# Patient Record
Sex: Male | Born: 1940 | Race: White | Hispanic: No | Smoking: Never smoker
Health system: Southern US, Community
[De-identification: ages and names within clinical notes are randomized; demographics above are authoritative.]

## PROBLEM LIST (undated history)

## (undated) DIAGNOSIS — D0359 Melanoma in situ of other part of trunk: Secondary | ICD-10-CM

## (undated) DIAGNOSIS — Z789 Other specified health status: Secondary | ICD-10-CM

## (undated) HISTORY — DX: Melanoma in situ of other part of trunk: D03.59

---

## 1998-06-17 ENCOUNTER — Ambulatory Visit (HOSPITAL_COMMUNITY): Admission: RE | Admit: 1998-06-17 | Discharge: 1998-06-17 | Payer: Self-pay | Admitting: Gastroenterology

## 2000-09-12 ENCOUNTER — Encounter: Admission: RE | Admit: 2000-09-12 | Discharge: 2000-09-12 | Payer: Self-pay | Admitting: Sports Medicine

## 2004-06-30 ENCOUNTER — Encounter (INDEPENDENT_AMBULATORY_CARE_PROVIDER_SITE_OTHER): Payer: Self-pay | Admitting: *Deleted

## 2004-06-30 ENCOUNTER — Ambulatory Visit (HOSPITAL_COMMUNITY): Admission: RE | Admit: 2004-06-30 | Discharge: 2004-06-30 | Payer: Self-pay | Admitting: Gastroenterology

## 2005-12-11 ENCOUNTER — Ambulatory Visit: Payer: Self-pay | Admitting: Sports Medicine

## 2005-12-12 ENCOUNTER — Ambulatory Visit: Payer: Self-pay | Admitting: Family Medicine

## 2009-10-14 ENCOUNTER — Ambulatory Visit: Payer: Self-pay | Admitting: Family Medicine

## 2009-10-14 ENCOUNTER — Encounter: Payer: Self-pay | Admitting: Family Medicine

## 2009-10-14 LAB — CONVERTED CEMR LAB
ALT: 23 units/L (ref 0–53)
BUN: 22 mg/dL (ref 6–23)
CO2: 26 meq/L (ref 19–32)
Cholesterol: 168 mg/dL (ref 0–200)
Creatinine, Ser: 1.04 mg/dL (ref 0.40–1.50)
HDL: 64 mg/dL (ref >39)
Total Bilirubin: 0.8 mg/dL (ref 0.3–1.2)
Total CHOL/HDL Ratio: 2.6
Triglycerides: 60 mg/dL (ref <150)
VLDL: 12 mg/dL (ref 0–40)

## 2009-10-17 ENCOUNTER — Encounter: Payer: Self-pay | Admitting: Family Medicine

## 2009-10-17 LAB — CONVERTED CEMR LAB: PSA: 0.77 ng/mL (ref 0.10–4.00)

## 2010-02-22 ENCOUNTER — Encounter: Payer: Self-pay | Admitting: Family Medicine

## 2010-09-27 ENCOUNTER — Telehealth: Payer: Self-pay | Admitting: Family Medicine

## 2010-09-28 ENCOUNTER — Encounter: Payer: Self-pay | Admitting: Family Medicine

## 2010-10-09 ENCOUNTER — Encounter: Payer: Self-pay | Admitting: Family Medicine

## 2010-12-28 NOTE — Consult Note (Signed)
Summary: [facility]  [facility]   Imported By: De Nurse 10/13/2010 16:56:48  _____________________________________________________________________  External Attachment:    Type:   Image     Comment:   External Document

## 2010-12-28 NOTE — Miscellaneous (Signed)
Summary: colonoscopy results  Clinical Lists Changes  Observations: Added new observation of HEMOCCRECACT: Not indicated (02/22/2010 14:57) Added new observation of DM PROGRESS: N/A (02/22/2010 14:57) Added new observation of DM FSREVIEW: N/A (02/22/2010 14:57) Added new observation of HTN PROGRESS: N/A (02/22/2010 14:57) Added new observation of HTN FSREVIEW: N/A (02/22/2010 14:57) Added new observation of LIPID PROGRS: N/A (02/22/2010 14:57) Added new observation of LIPID FSREVW: N/A (02/22/2010 14:57) Added new observation of COLONOSCOPY: Small internal and external hemorrhoids; diverticulosis in the sigmoid, decending and tranverse colon; one small polyp in the recto-sigmoid-resected and retreived. (02/13/2010 14:59)      Colonoscopy  Procedure date:  02/13/2010  Findings:      Small internal and external hemorrhoids; diverticulosis in the sigmoid, decending and tranverse colon; one small polyp in the recto-sigmoid-resected and retreived.    Prevention & Chronic Care Immunizations   Influenza vaccine: Fluvax MCR  (10/14/2009)    Tetanus booster: 10/14/2009: Tdap    Pneumococcal vaccine: Pneumovax (Medicare)  (10/14/2009)    H. zoster vaccine: Not documented  Colorectal Screening   Hemoccult: Not documented   Hemoccult action/deferral: Not indicated  (02/22/2010)    Colonoscopy: Small internal and external hemorrhoids; diverticulosis in the sigmoid, decending and tranverse colon; one small polyp in the recto-sigmoid-resected and retreived.  (02/13/2010)  Other Screening   PSA: 0.77  (10/17/2009)   Smoking status: never  (10/14/2009)  Lipids   Total Cholesterol: 168  (10/14/2009)   Lipid panel action/deferral: Lipid Panel ordered   LDL: 92  (10/14/2009)   LDL Direct: Not documented   HDL: 64  (10/14/2009)   Triglycerides: 60  (10/14/2009)

## 2010-12-28 NOTE — Progress Notes (Signed)
Summary: Letter  Phone Note Call from Patient Call back at [phone]   Reason for Call: Talk to Nurse Summary of Call: pts wife is requesting a letter stating pt needs to have a hearing test since it has been 5 years so pt may get an adjustment.  Initial call taken by: Knox Royalty,  September 27, 2010 9:09 AM  Follow-up for Phone Call        completed and placed up front for pick up Follow-up by: Luretha Murphy NP,  September 28, 2010 8:24 AM     Forwarded to Haydee Monica NP.  Starleen Blue RN  September 27, 2010 11:55 AM

## 2010-12-28 NOTE — Letter (Signed)
Summary: Generic Letter  [facility]  [address]   [place], [place] [address]   Phone: [phone]  Fax: [phone]    09/28/2010  Regarding:  ELSTER CORBELLO [address] [place], [place]  [address]  To whom it may concern,  This is to verify that Jacqulyn Ducking has not has his hearing evaluated for 5 years and is due for this testing.  Hearing is an essential component to functioning at a high level in life.           Sincerely,   Luretha Murphy NP

## 2011-01-03 ENCOUNTER — Encounter: Payer: Self-pay | Admitting: *Deleted

## 2011-01-25 ENCOUNTER — Encounter: Payer: Self-pay | Admitting: Home Health Services

## 2011-04-13 NOTE — Op Note (Signed)
NAME:  Stephen [name], Stephen [name] NO.:  [id number]   MEDICAL RECORD NO.:  [id number]                   PATIENT TYPE:  AMB   LOCATION:  ENDO                                 FACILITY:  [facility]   PHYSICIAN:  Petra Kuba, M.D.                 DATE OF BIRTH:  Mar 31, 1941   DATE OF PROCEDURE:  06/30/2004  DATE OF DISCHARGE:                                 OPERATIVE REPORT   PROCEDURE:  Colonoscopy.   INDICATION:  History of colon polyps, due for repeat screening.  Consent was  signed after risks, benefits, methods, options thoroughly discussed multiple  times in the past.  No sedation was used per patient request.   DESCRIPTION OF PROCEDURE:  Rectal inspection was pertinent for small  external hemorrhoids.  Digital exam was negative.  The video pediatric  adjustable colonoscope was inserted and with moderate difficulty due to a  long, looping, tortuous colon, with rolling him on his back and abdominal  pressure, was finally able to advance to the cecum.  On insertion, we saw  some left-sided diverticula but no other obvious abnormalities.  Cecum was  identified by the appendiceal orifice and the ileocecal valve.  The scope  was slowly withdrawn.  The prep was fairly adequate.  There was some stool  adherent to the wall which could not be washed off but on slow withdrawal  through the colon, the cecum and the ascending were normal.  A tiny polyp in  the hepatic flexure was seen and hot biopsied x 1.  The scope was further  withdrawn.  Other than the left-sided diverticula, no other polypoid lesions  were seen.  Once back to in the rectum, anorectal pull-through and  retroflexion confirmed some small hemorrhoids.  The scope was straightened  and readvanced a short ways up the left side of the colon; air was  suctioned, scope removed.  The patient tolerated the procedure well.  There  was no obvious immediate complication.   ENDOSCOPIC DIAGNOSES:  1. Small internal and  external hemorrhoids.  2. Left-sided diverticula.  3. Long, looping, tortuous colon.  4. Tiny hepatic flexure polyp, hot biopsied.  5. Otherwise, within normal limits to the cecum.   PLAN:  1. Await pathology but probably recheck colon screening in 5 years.  2. Happy to see back p.r.n..  3. Otherwise, return care to Dr. Darrick Penna for the customary health care     maintenance to include yearly rectals and guaiacs.                                               Petra Kuba, M.D.    MEM/MEDQ  D:  06/30/2004  T:  07/02/2004  Job:  [id number]   cc:   Theodoro Grist, MD  [facility]  [address]

## 2011-05-09 ENCOUNTER — Encounter: Payer: Self-pay | Admitting: Home Health Services

## 2011-05-09 ENCOUNTER — Ambulatory Visit (INDEPENDENT_AMBULATORY_CARE_PROVIDER_SITE_OTHER): Payer: Medicare Other | Admitting: Home Health Services

## 2011-05-09 VITALS — BP 127/78 | HR 64 | Temp 98.3°F | Ht 72.0 in | Wt 158.0 lb

## 2011-05-09 DIAGNOSIS — Z0000 Encounter for general adult medical examination without abnormal findings: Secondary | ICD-10-CM

## 2011-05-09 NOTE — Progress Notes (Signed)
Patient here for annual wellness visit, patient reports: Risk Factors/Conditions needing evaluation or treatment: Patient does not have any risk factors that need evaluation. Home Safety: Patient lives with spouse in 2 story home.  Pt reports having smoke detectors and adaptive equipment in bathroom. Other Information: Corrective lens: Patient has corrective lens for reading.  Visits eye doctor every 2 years. Dentures: Patient has a partial and a bridge and visits dentist every 6 months. Memory: Patient reports some memory loss. Patient's Mini Mental Score (recorded in doc. flowsheet): 30 Patient wears hearing aids in both ears.  Balance/Gait: Pt does not have any noticeable impairment.  Balance Abnormal Patient value  Sitting balance    Sit to stand    Attempts to arise    Immediate standing balance    Standing balance    Nudge    Eyes closed- Romberg    Tandem stance    Back lean    Neck Rotation    360 degree turn    Sitting down     Gait Abnormal Patient value  Initiation of gait    Step length-left    Step length-right    Step height-left    Step height-right    Step symmetry    Step continuity    Path deviation    Trunk movement    Walking stance        Annual Wellness Visit Requirements Recorded Today In  Medical, family, social history Past Medical, Family, Social History Section  Current providers Care team  Current medications Medications  Wt, BP, Ht, BMI Vital signs  Hearing assessment (welcome visit) Declined- has hearing aids  Tobacco, alcohol, illicit drug use History  ADL Nurse Assessment  Depression Screening Nurse Assessment  Cognitive impairment Nurse Assessment  Mini Mental Status Document Flowsheet  Fall Risk Nurse Assessment  Home Safety Progress Note  End of Life Planning (welcome visit) Social Documentation  Medicare preventative services Progress Note  Risk factors/conditions needing evaluation/treatment Progress Note  Personalized  health advice Patient Instructions, goals, letter  Diet & Exercise Social Documentation  Emergency Contact Social Documentation  Seat Belts Social Documentation  Sun exposure/protection Social Documentation    Medicare Prevention Plan: Recommended pt get shingles vaccine.   Recommended Medicare Prevention Screenings Men over 40 Test For Frequency Date of Last- BOLD if needed  Colorectal Cancer 1-10 yrs 3/11  Prostate Cancer Never or yearly 11/10  Aortic Aneurysm Once if 65-75 with hx of smoking Discuss with Darl Pikes if concerned  Cholesterol 5 yrs 11/10  Diabetes yearly Non diabetic  HIV yearly declined  Influenza Shot yearly 2011- pt reported  Pneumonia Shot once 2011- pt reported  Zostavax Shot once recommended

## 2011-05-09 NOTE — Patient Instructions (Signed)
1. Consider getting shingles vaccine at pharmacy. 2. Bring in copy of medical wishes.  3. If you have a record of when you got your pneumonia vaccine, please bring a copy to [place].  4. I have included some information about [employer] Silver Sneakers program. Consider contacting [employer] for enrollment.

## 2011-05-10 ENCOUNTER — Encounter: Payer: Self-pay | Admitting: Home Health Services

## 2011-05-10 NOTE — Progress Notes (Signed)
  Subjective:    Patient ID: Stephen [name], male    DOB: 1940-12-23, 70 y.o.   MRN: [id number]  HPI    Review of Systems     Objective:   Physical Exam        Assessment & Plan:  Reviewed Stephen [name]'s note and agree with assessment and plan.

## 2012-03-06 ENCOUNTER — Encounter: Payer: Self-pay | Admitting: Family Medicine

## 2012-03-06 ENCOUNTER — Ambulatory Visit (INDEPENDENT_AMBULATORY_CARE_PROVIDER_SITE_OTHER): Payer: Medicare Other | Admitting: Family Medicine

## 2012-03-06 VITALS — BP 133/74 | HR 49 | Ht 72.0 in | Wt 162.0 lb

## 2012-03-06 DIAGNOSIS — G3184 Mild cognitive impairment, so stated: Secondary | ICD-10-CM | POA: Insufficient documentation

## 2012-03-06 DIAGNOSIS — R413 Other amnesia: Secondary | ICD-10-CM

## 2012-03-06 NOTE — Assessment & Plan Note (Addendum)
I think this is normal aging with possibility of very early alzheimer's. My ROS and testing rules out motor diseases. Recommended brain exercises.

## 2012-03-06 NOTE — Patient Instructions (Signed)
[email or web address]  Great brain exercises.

## 2012-03-06 NOTE — Progress Notes (Addendum)
  Subjective:    Patient ID: Stephen [name], male    DOB: 1941-05-28, 71 y.o.   MRN: [id number]  HPI 1. Short term memory loss, inability to focus on certain tasks.  History of noting one year of worsening memory, short term, and memory loss while performing tasks. He has always had some minor difficulties with short term memory. 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 He exercises daily and has no other symptoms.  MMSE: 30/30 PHQ-9 0/9  Tobacco use: Patient is a non smoker Daily one drink a day. No drugs. Actively exercises Very balanced diet with fruits and vegetables.  Review of Systems Pertinent items are noted in HPI. No GI symptosm. No motor symptoms.     Objective:   Physical Exam Filed Vitals:   03/06/12 0858  BP: 133/74  Pulse: 49  Height: 6' (1.829 m)  Weight: 162 lb (73.483 kg)  Psych:  Cognition and judgment appear intact. Alert, communicative  and cooperative with normal attention span and concentration. No apparent delusions, illusions, hallucinations Neurologic exam : Cn 2-7 intact Strength equal & normal in upper & lower extremities Able to walk on heels and toes.   Balance normal  Romberg normal, finger to nose   No cogwheel, no tremors, no shuffling gait. No gait abnormalities.   On the [facility] Cognitive Assessment no deficits were found  I interviewed and examined this patient and discussed the care plan with Dr. Rivka Safer  and agree with assessment and plan as documented in the visit note for today.    Stephen A. Sheffield Slider, MD Family Medicine Teaching Service Attending  03/13/2012 4:39 PM  Assessment & Plan:

## 2012-11-26 HISTORY — PX: OTHER SURGICAL HISTORY: SHX169

## 2013-02-17 ENCOUNTER — Other Ambulatory Visit: Payer: Self-pay | Admitting: Dermatology

## 2013-02-26 ENCOUNTER — Ambulatory Visit (INDEPENDENT_AMBULATORY_CARE_PROVIDER_SITE_OTHER): Payer: 59 | Admitting: General Surgery

## 2013-02-26 ENCOUNTER — Encounter (INDEPENDENT_AMBULATORY_CARE_PROVIDER_SITE_OTHER): Payer: Self-pay | Admitting: General Surgery

## 2013-02-26 VITALS — BP 132/76 | HR 64 | Temp 98.6°F | Resp 18 | Ht 73.0 in | Wt 162.0 lb

## 2013-02-26 DIAGNOSIS — C4359 Malignant melanoma of other part of trunk: Secondary | ICD-10-CM

## 2013-02-26 NOTE — Progress Notes (Signed)
Patient ID: Stephen [name], male   DOB: 06/03/1941, 72 y.o.   MRN: [id number]  Chief Complaint  Patient presents with  . New Evaluation    melannoma rt side shoulder    HPI Stephen [name] is a 72 y.o. male.  Referred by Dr. Houston HPI This is a 72-year-old healthy male who has a history of basal and squamous cell cancers. He was recently seen by Dr. 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 This malignant melanomas a superficial spreading melanoma that is at least 1.35 mm thick. Mitoses are greater than 10 per millimeter squared, regression is present, and ulceration is present. The lesion does extend to the deep specimen edge right now. He comes in today without any complaints to discuss excision.  History reviewed. No pertinent past medical history.  Past Surgical History  Procedure Laterality Date  . Basal /sq cell removal  2014    x-4 times removal    Family History  Problem Relation Age of Onset  . Heart disease Mother   . Diabetes Mother   . Arthritis Mother   . Glaucoma Father   . Heart disease Father   . Arthritis Father     Social History History  Substance Use Topics  . Smoking status: Never Smoker   . Smokeless tobacco: Never Used  . Alcohol Use: 3.5 oz/week    7 drink(s) per week    No Known Allergies  Current Outpatient Prescriptions  Medication Sig Dispense Refill  . hydrocortisone valerate cream (WESTCORT) 0.2 %       . zoster vaccine live, PF, (ZOSTAVAX) 19400 UNT/0.65ML injection Administer once        No current facility-administered medications for this visit.    Review of Systems Review of Systems  Constitutional: Negative for fever, chills and unexpected weight change.  HENT: Negative for hearing loss, congestion, sore throat, trouble swallowing and voice change.   Eyes: Negative  for visual disturbance.  Respiratory: Negative for cough and wheezing.   Cardiovascular: Negative for chest pain, palpitations and leg swelling.  Gastrointestinal: Negative for nausea, vomiting, abdominal pain, diarrhea, constipation, blood in stool, abdominal distention, anal bleeding and rectal pain.  Genitourinary: Negative for hematuria and difficulty urinating.  Musculoskeletal: Negative for arthralgias.  Skin: Negative for rash and wound.  Neurological: Negative for seizures, syncope, weakness and headaches.  Hematological: Negative for adenopathy. Does not bruise/bleed easily.  Psychiatric/Behavioral: Negative for confusion.    Blood pressure 132/76, pulse 64, temperature 98.6 F (37 C), resp. rate 18, height 6' 1" (1.854 m), weight 162 lb (73.483 kg).  Physical Exam Physical Exam  Vitals reviewed. Constitutional: He appears well-developed and well-nourished.  Cardiovascular: Normal rate, regular rhythm and normal heart sounds.   Pulmonary/Chest: Effort normal and breath sounds normal. He has no wheezes. He has no rales.    Lymphadenopathy:    He has no cervical adenopathy.    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 I discussed with him a wide local excision with margins as well as a sentinel lymph node biopsy. We discussed the postoperative restrictions. We discussed the performance of a sentinel lymph node biopsy. I'm going to have him get a lymphoscintigram. I will then schedule   him for wide local excision with sentinel node biopsy next week.        Joleah Kosak 02/26/2013, 3:18 PM    

## 2013-02-27 ENCOUNTER — Encounter (HOSPITAL_COMMUNITY)
Admission: RE | Admit: 2013-02-27 | Discharge: 2013-02-27 | Disposition: A | Discharge status: Home / Self Care | Payer: Medicare Other | Source: Ambulatory Visit | Attending: General Surgery | Admitting: General Surgery

## 2013-02-27 ENCOUNTER — Encounter: Payer: Self-pay | Admitting: Family Medicine

## 2013-02-27 ENCOUNTER — Other Ambulatory Visit (INDEPENDENT_AMBULATORY_CARE_PROVIDER_SITE_OTHER): Payer: Self-pay | Admitting: General Surgery

## 2013-02-27 DIAGNOSIS — C4359 Malignant melanoma of other part of trunk: Secondary | ICD-10-CM | POA: Insufficient documentation

## 2013-02-27 DIAGNOSIS — C439 Malignant melanoma of skin, unspecified: Secondary | ICD-10-CM | POA: Insufficient documentation

## 2013-02-27 MED ORDER — TECHNETIUM TC 99M SULFUR COLLOID FILTERED
0.5000 | Freq: Once | INTRAVENOUS | Status: AC | PRN
  Administered 2013-02-27: 0.5 via INTRADERMAL

## 2013-03-03 ENCOUNTER — Encounter (HOSPITAL_COMMUNITY)
Admission: RE | Admit: 2013-03-03 | Discharge: 2013-03-03 | Disposition: A | Discharge status: Home / Self Care | Payer: Medicare Other | Source: Ambulatory Visit | Attending: General Surgery | Admitting: General Surgery

## 2013-03-03 ENCOUNTER — Encounter (HOSPITAL_COMMUNITY): Payer: Self-pay

## 2013-03-03 HISTORY — DX: Other specified health status: Z78.9

## 2013-03-03 LAB — BASIC METABOLIC PANEL
BUN: 16 mg/dL (ref 6–23)
Chloride: 106 mEq/L (ref 96–112)
Creatinine, Ser: 1.04 mg/dL (ref 0.50–1.35)
GFR calc Af Amer: 81 mL/min — ABNORMAL LOW (ref >90)
Glucose, Bld: 100 mg/dL — ABNORMAL HIGH (ref 70–99)

## 2013-03-03 LAB — CBC WITH DIFFERENTIAL/PLATELET
Basophils Absolute: 0.1 10*3/uL (ref 0.0–0.1)
Basophils Relative: 1 % (ref 0–1)
Eosinophils Absolute: 0 10*3/uL (ref 0.0–0.7)
Eosinophils Relative: 1 % (ref 0–5)
HCT: 41.9 % (ref 39.0–52.0)
MCH: 31.3 pg (ref 26.0–34.0)
MCHC: 35.1 g/dL (ref 30.0–36.0)
Monocytes Absolute: 0.2 10*3/uL (ref 0.1–1.0)
Monocytes Relative: 4 % (ref 3–12)
Neutro Abs: 3.2 10*3/uL (ref 1.7–7.7)
RDW: 12.7 % (ref 11.5–15.5)

## 2013-03-03 NOTE — Pre-Procedure Instructions (Signed)
Victoria Euceda  03/03/2013   Your procedure is scheduled on:  03/05/13  Report to [facility] at 8 AM.  Call this number if you have problems the morning of surgery: [phone]   Remember:   Do not eat food or drink liquids after midnight.   Take these medicines the morning of surgery with A SIP OF WATER: none   Do not wear jewelry, make-up or nail polish.  Do not wear lotions, powders, or perfumes. You may wear deodorant.  Do not shave 48 hours prior to surgery. Men may shave face and neck.  Do not bring valuables to the hospital.  Contacts, dentures or bridgework may not be worn into surgery.  Leave suitcase in the car. After surgery it may be brought to your room.  For patients admitted to the hospital, checkout time is 11:00 AM the day of  discharge.   Patients discharged the day of surgery will not be allowed to drive  home.  Name and phone number of your driver: family  Special Instructions: Shower using CHG 2 nights before surgery and the night before surgery.  If you shower the day of surgery use CHG.  Use special wash - you have one bottle of CHG for all showers.  You should use approximately 1/3 of the bottle for each shower.   Please read over the following fact sheets that you were given: Pain Booklet, Coughing and Deep Breathing, MRSA Information and Surgical Site Infection Prevention

## 2013-03-04 ENCOUNTER — Encounter (HOSPITAL_COMMUNITY): Payer: Self-pay | Admitting: Pharmacy Technician

## 2013-03-04 MED ORDER — CEFAZOLIN SODIUM-DEXTROSE 2-3 GM-% IV SOLR
2.0000 g | INTRAVENOUS | Status: AC
  Administered 2013-03-05: 2 g via INTRAVENOUS
  Filled 2013-03-04: qty 50

## 2013-03-05 ENCOUNTER — Encounter (HOSPITAL_COMMUNITY): Payer: Self-pay | Admitting: Surgery

## 2013-03-05 ENCOUNTER — Ambulatory Visit (HOSPITAL_COMMUNITY)
Admission: RE | Admit: 2013-03-05 | Discharge: 2013-03-05 | Disposition: A | Discharge status: Home / Self Care | Payer: Medicare Other | Source: Ambulatory Visit | Attending: General Surgery | Admitting: General Surgery

## 2013-03-05 ENCOUNTER — Encounter (HOSPITAL_COMMUNITY)
Admission: RE | Disposition: A | Discharge status: Home / Self Care | Payer: Self-pay | Source: Ambulatory Visit | Attending: General Surgery

## 2013-03-05 ENCOUNTER — Encounter (HOSPITAL_COMMUNITY)
Admission: RE | Admit: 2013-03-05 | Discharge: 2013-03-05 | Disposition: A | Discharge status: Home / Self Care | Payer: Medicare Other | Source: Ambulatory Visit | Attending: General Surgery | Admitting: General Surgery

## 2013-03-05 ENCOUNTER — Ambulatory Visit (HOSPITAL_COMMUNITY): Payer: Medicare Other | Admitting: Anesthesiology

## 2013-03-05 ENCOUNTER — Encounter (HOSPITAL_COMMUNITY): Payer: Self-pay | Admitting: Anesthesiology

## 2013-03-05 ENCOUNTER — Telehealth (INDEPENDENT_AMBULATORY_CARE_PROVIDER_SITE_OTHER): Payer: Self-pay

## 2013-03-05 DIAGNOSIS — C4360 Malignant melanoma of unspecified upper limb, including shoulder: Secondary | ICD-10-CM | POA: Insufficient documentation

## 2013-03-05 DIAGNOSIS — C439 Malignant melanoma of skin, unspecified: Secondary | ICD-10-CM | POA: Insufficient documentation

## 2013-03-05 DIAGNOSIS — L905 Scar conditions and fibrosis of skin: Secondary | ICD-10-CM | POA: Insufficient documentation

## 2013-03-05 DIAGNOSIS — Z85828 Personal history of other malignant neoplasm of skin: Secondary | ICD-10-CM | POA: Insufficient documentation

## 2013-03-05 DIAGNOSIS — C4359 Malignant melanoma of other part of trunk: Secondary | ICD-10-CM

## 2013-03-05 HISTORY — PX: AXILLARY SENTINEL NODE BIOPSY: SHX5738

## 2013-03-05 HISTORY — PX: EXCISION MELANOMA WITH SENTINEL LYMPH NODE BIOPSY: SHX5628

## 2013-03-05 SURGERY — EXCISION, MELANOMA, WITH SENTINEL LYMPH NODE BIOPSY
Anesthesia: General | Site: Back | Laterality: Right | Wound class: Clean

## 2013-03-05 MED ORDER — OXYCODONE HCL 5 MG PO TABS: 5.0000 mg | ORAL_TABLET | Freq: Once | ORAL | Status: DC

## 2013-03-05 MED ORDER — 0.9 % SODIUM CHLORIDE (POUR BTL) OPTIME
TOPICAL | Status: DC | PRN
  Administered 2013-03-05: 1000 mL

## 2013-03-05 MED ORDER — TECHNETIUM TC 99M SULFUR COLLOID FILTERED
0.5000 | Freq: Once | INTRAVENOUS | Status: AC | PRN
  Administered 2013-03-05: 0.5 via INTRADERMAL

## 2013-03-05 MED ORDER — SODIUM CHLORIDE 0.9 % IJ SOLN
INTRAMUSCULAR | Status: DC | PRN
  Administered 2013-03-05: 11:00:00

## 2013-03-05 MED ORDER — ROCURONIUM BROMIDE 100 MG/10ML IV SOLN
INTRAVENOUS | Status: DC | PRN
  Administered 2013-03-05: 40 mg via INTRAVENOUS

## 2013-03-05 MED ORDER — LACTATED RINGERS IV SOLN
INTRAVENOUS | Status: DC | PRN
  Administered 2013-03-05 (×2): via INTRAVENOUS

## 2013-03-05 MED ORDER — OXYCODONE-ACETAMINOPHEN 5-325 MG PO TABS: 1.0000 | ORAL_TABLET | ORAL | Status: DC | PRN

## 2013-03-05 MED ORDER — PROPOFOL 10 MG/ML IV BOLUS
INTRAVENOUS | Status: DC | PRN
  Administered 2013-03-05: 180 mg via INTRAVENOUS

## 2013-03-05 MED ORDER — BACITRACIN ZINC 500 UNIT/GM EX OINT
TOPICAL_OINTMENT | CUTANEOUS | Status: DC | PRN
  Administered 2013-03-05: 1 via TOPICAL

## 2013-03-05 MED ORDER — LIDOCAINE HCL (CARDIAC) 20 MG/ML IV SOLN
INTRAVENOUS | Status: DC | PRN
  Administered 2013-03-05: 80 mg via INTRAVENOUS

## 2013-03-05 MED ORDER — HYDROMORPHONE HCL PF 1 MG/ML IJ SOLN: 0.2500 mg | INTRAMUSCULAR | Status: DC | PRN

## 2013-03-05 MED ORDER — LACTATED RINGERS IV SOLN
INTRAVENOUS | Status: DC
  Administered 2013-03-05: 09:00:00 via INTRAVENOUS

## 2013-03-05 MED ORDER — FENTANYL CITRATE 0.05 MG/ML IJ SOLN
INTRAMUSCULAR | Status: DC | PRN
  Administered 2013-03-05: 100 ug via INTRAVENOUS
  Administered 2013-03-05: 50 ug via INTRAVENOUS

## 2013-03-05 MED ORDER — OXYCODONE HCL 5 MG PO TABS
ORAL_TABLET | ORAL | Status: AC
  Administered 2013-03-05: 5 mg
  Filled 2013-03-05: qty 1

## 2013-03-05 MED ORDER — MIDAZOLAM HCL 5 MG/5ML IJ SOLN
INTRAMUSCULAR | Status: DC | PRN
  Administered 2013-03-05: 2 mg via INTRAVENOUS

## 2013-03-05 MED ORDER — EPHEDRINE SULFATE 50 MG/ML IJ SOLN
INTRAMUSCULAR | Status: DC | PRN
  Administered 2013-03-05: 5 mg via INTRAVENOUS
  Administered 2013-03-05 (×2): 10 mg via INTRAVENOUS

## 2013-03-05 MED ORDER — ONDANSETRON HCL 4 MG/2ML IJ SOLN
INTRAMUSCULAR | Status: DC | PRN
  Administered 2013-03-05: 4 mg via INTRAVENOUS

## 2013-03-05 MED ORDER — METHYLENE BLUE 1 % INJ SOLN
INTRAMUSCULAR | Status: AC
  Filled 2013-03-05: qty 10

## 2013-03-05 MED ORDER — BUPIVACAINE-EPINEPHRINE PF 0.25-1:200000 % IJ SOLN
INTRAMUSCULAR | Status: AC
  Filled 2013-03-05: qty 30

## 2013-03-05 MED ORDER — GLYCOPYRROLATE 0.2 MG/ML IJ SOLN
INTRAMUSCULAR | Status: DC | PRN
  Administered 2013-03-05: 0.4 mg via INTRAVENOUS
  Administered 2013-03-05: 0.2 mg via INTRAVENOUS

## 2013-03-05 MED ORDER — NEOSTIGMINE METHYLSULFATE 1 MG/ML IJ SOLN
INTRAMUSCULAR | Status: DC | PRN
  Administered 2013-03-05: 3 mg via INTRAVENOUS

## 2013-03-05 SURGICAL SUPPLY — 61 items
APPLIER CLIP 11 MED OPEN (CLIP) ×3
BENZOIN TINCTURE PRP APPL 2/3 (GAUZE/BANDAGES/DRESSINGS) ×6 IMPLANT
BLADE SURG 10 STRL SS (BLADE) ×3 IMPLANT
BLADE SURG 15 STRL LF DISP TIS (BLADE) ×2 IMPLANT
BLADE SURG 15 STRL SS (BLADE) ×1
CANISTER SUCTION 2500CC (MISCELLANEOUS) ×3 IMPLANT
CHLORAPREP W/TINT 10.5 ML (MISCELLANEOUS) ×6 IMPLANT
CLIP APPLIE 11 MED OPEN (CLIP) ×2 IMPLANT
CLOTH BEACON ORANGE TIMEOUT ST (SAFETY) ×3 IMPLANT
CLSR STERI-STRIP ANTIMIC 1/2X4 (GAUZE/BANDAGES/DRESSINGS) ×6 IMPLANT
CONT SPEC 4OZ CLIKSEAL STRL BL (MISCELLANEOUS) ×6 IMPLANT
COVER PROBE W GEL 5X96 (DRAPES) ×3 IMPLANT
COVER SURGICAL LIGHT HANDLE (MISCELLANEOUS) ×3 IMPLANT
DERMABOND ADVANCED (GAUZE/BANDAGES/DRESSINGS)
DERMABOND ADVANCED .7 DNX12 (GAUZE/BANDAGES/DRESSINGS) IMPLANT
DRAPE LAPAROTOMY T 102X78X121 (DRAPES) ×3 IMPLANT
DRAPE PED LAPAROTOMY (DRAPES) ×6 IMPLANT
DRAPE UTILITY 15X26 W/TAPE STR (DRAPE) ×6 IMPLANT
DRSG TEGADERM 4X4.75 (GAUZE/BANDAGES/DRESSINGS) ×6 IMPLANT
ELECT CAUTERY BLADE 6.4 (BLADE) ×6 IMPLANT
ELECT REM PT RETURN 9FT ADLT (ELECTROSURGICAL) ×3
ELECTRODE REM PT RTRN 9FT ADLT (ELECTROSURGICAL) ×2 IMPLANT
EVACUATOR SILICONE 100CC (DRAIN) ×3 IMPLANT
GLOVE BIO SURGEON STRL SZ 6.5 (GLOVE) ×3 IMPLANT
GLOVE BIO SURGEON STRL SZ7 (GLOVE) ×6 IMPLANT
GLOVE BIO SURGEON STRL SZ7.5 (GLOVE) ×9 IMPLANT
GLOVE BIOGEL PI IND STRL 7.0 (GLOVE) ×2 IMPLANT
GLOVE BIOGEL PI IND STRL 7.5 (GLOVE) ×4 IMPLANT
GLOVE BIOGEL PI INDICATOR 7.0 (GLOVE) ×1
GLOVE BIOGEL PI INDICATOR 7.5 (GLOVE) ×2
GLOVE ECLIPSE 6.5 STRL STRAW (GLOVE) ×3 IMPLANT
GLOVE SURG SS PI 6.5 STRL IVOR (GLOVE) ×3 IMPLANT
GOWN PREVENTION PLUS XLARGE (GOWN DISPOSABLE) ×6 IMPLANT
GOWN STRL NON-REIN LRG LVL3 (GOWN DISPOSABLE) ×15 IMPLANT
KIT BASIN OR (CUSTOM PROCEDURE TRAY) ×3 IMPLANT
KIT ROOM TURNOVER OR (KITS) ×3 IMPLANT
MARKER SKIN DUAL TIP RULER LAB (MISCELLANEOUS) ×3 IMPLANT
NEEDLE 18GX1X1/2 (RX/OR ONLY) (NEEDLE) ×3 IMPLANT
NEEDLE HYPO 25GX1X1/2 BEV (NEEDLE) ×6 IMPLANT
NS IRRIG 1000ML POUR BTL (IV SOLUTION) ×3 IMPLANT
PACK SURGICAL SETUP 50X90 (CUSTOM PROCEDURE TRAY) ×3 IMPLANT
PAD ARMBOARD 7.5X6 YLW CONV (MISCELLANEOUS) ×3 IMPLANT
PENCIL BUTTON HOLSTER BLD 10FT (ELECTRODE) ×6 IMPLANT
SPECIMEN JAR MEDIUM (MISCELLANEOUS) ×3 IMPLANT
SPONGE GAUZE 4X4 12PLY (GAUZE/BANDAGES/DRESSINGS) ×6 IMPLANT
SPONGE LAP 18X18 X RAY DECT (DISPOSABLE) ×3 IMPLANT
SPONGE LAP 4X18 X RAY DECT (DISPOSABLE) ×3 IMPLANT
STAPLER VISISTAT 35W (STAPLE) ×3 IMPLANT
STRIP CLOSURE SKIN 1/2X4 (GAUZE/BANDAGES/DRESSINGS) ×6 IMPLANT
SUT ETHILON 2 0 FS 18 (SUTURE) ×3 IMPLANT
SUT ETHILON 3 0 FSL (SUTURE) ×18 IMPLANT
SUT MNCRL AB 4-0 PS2 18 (SUTURE) ×3 IMPLANT
SUT MON AB 4-0 PC3 18 (SUTURE) ×3 IMPLANT
SUT VIC AB 3-0 SH 18 (SUTURE) ×3 IMPLANT
SUT VIC AB 3-0 SH 8-18 (SUTURE) ×9 IMPLANT
SYR CONTROL 10ML LL (SYRINGE) ×6 IMPLANT
TOWEL OR 17X24 6PK STRL BLUE (TOWEL DISPOSABLE) ×3 IMPLANT
TOWEL OR 17X26 10 PK STRL BLUE (TOWEL DISPOSABLE) ×3 IMPLANT
TUBE CONNECTING 12X1/4 (SUCTIONS) ×3 IMPLANT
TUBE CONNECTING 20X1/4 (TUBING) ×3 IMPLANT
YANKAUER SUCT BULB TIP NO VENT (SUCTIONS) ×6 IMPLANT

## 2013-03-05 NOTE — Telephone Encounter (Signed)
LMOM giving pt f/u appt with Dr Dwain Sarna in 2wks for 4/25 arrive at 8:15/8:30.

## 2013-03-05 NOTE — Interval H&P Note (Signed)
History and Physical Interval Note:  03/05/2013 9:59 AM  Stephen [name]  has presented today for surgery, with the diagnosis of melanoma  The various methods of treatment have been discussed with the patient and family. After consideration of risks, benefits and other options for treatment, the patient has consented to  Procedure(s): WIDE LOCAL EXCISION RIGHT UPPER BACK  MELANOMA WITH RIGHT AXILLARYSENTINEL NODE BIOPSY (Right) as a surgical intervention .  The patient's history has been reviewed, patient examined, no change in status, stable for surgery.  I have reviewed the patient's chart and labs.  Questions were answered to the patient's satisfaction.     Zaki Gertsch

## 2013-03-05 NOTE — Anesthesia Postprocedure Evaluation (Signed)
  Anesthesia Post-op Note  Patient: Stephen [name]  Procedure(s) Performed: Procedure(s): WIDE LOCAL EXCISION RIGHT UPPER BACK  MELANOMA WITH RIGHT AXILLARYSENTINEL NODE BIOPSY (Right) AXILLARY SENTINEL NODE BIOPSY (Right)  Patient Location: PACU  Anesthesia Type:General  Level of Consciousness: awake  Airway and Oxygen Therapy: Patient Spontanous Breathing  Post-op Pain: mild  Post-op Assessment: Post-op Vital signs reviewed  Post-op Vital Signs: Reviewed  Complications: No apparent anesthesia complications

## 2013-03-05 NOTE — Transfer of Care (Signed)
Immediate Anesthesia Transfer of Care Note  Patient: Stephen [name]  Procedure(s) Performed: Procedure(s): WIDE LOCAL EXCISION RIGHT UPPER BACK  MELANOMA WITH RIGHT AXILLARYSENTINEL NODE BIOPSY (Right) AXILLARY SENTINEL NODE BIOPSY (Right)  Patient Location: PACU  Anesthesia Type:General  Level of Consciousness: responds to stimulation  Airway & Oxygen Therapy: Patient Spontanous Breathing and Patient connected to nasal cannula oxygen  Post-op Assessment: Report given to PACU RN and Post -op Vital signs reviewed and stable  Post vital signs: Reviewed and stable  Complications: No apparent anesthesia complications

## 2013-03-05 NOTE — Preoperative (Signed)
Beta Blockers   Reason not to administer Beta Blockers:Not Applicable 

## 2013-03-05 NOTE — H&P (View-Only) (Signed)
Patient ID: Stephen [name], male   DOB: 1940/12/22, 72 y.o.   MRN: [id number]  Chief Complaint  Patient presents with  . New Evaluation    melannoma rt side shoulder    HPI Stephen [name] is a 72 y.o. male.  Referred by Dr. Londell Moh HPI This is a 72 year old healthy male who has a history of basal and squamous cell cancers. He was recently seen by Dr. 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 This malignant melanomas a superficial spreading melanoma that is at least 1.35 mm thick. Mitoses are greater than 10 per millimeter squared, regression is present, and ulceration is present. The lesion does extend to the deep specimen edge right now. He comes in today without any complaints to discuss excision.  History reviewed. No pertinent past medical history.  Past Surgical History  Procedure Laterality Date  . Basal /sq cell removal  2014    x-4 times removal    Family History  Problem Relation Age of Onset  . Heart disease Mother   . Diabetes Mother   . Arthritis Mother   . Glaucoma Father   . Heart disease Father   . Arthritis Father     Social History History  Substance Use Topics  . Smoking status: Never Smoker   . Smokeless tobacco: Never Used  . Alcohol Use: 3.5 oz/week    7 drink(s) per week    No Known Allergies  Current Outpatient Prescriptions  Medication Sig Dispense Refill  . hydrocortisone valerate cream (WESTCORT) 0.2 %       . zoster vaccine live, PF, (ZOSTAVAX) 40981 UNT/0.65ML injection Administer once        No current facility-administered medications for this visit.    Review of Systems Review of Systems  Constitutional: Negative for fever, chills and unexpected weight change.  HENT: Negative for hearing loss, congestion, sore throat, trouble swallowing and voice change.   Eyes: Negative  for visual disturbance.  Respiratory: Negative for cough and wheezing.   Cardiovascular: Negative for chest pain, palpitations and leg swelling.  Gastrointestinal: Negative for nausea, vomiting, abdominal pain, diarrhea, constipation, blood in stool, abdominal distention, anal bleeding and rectal pain.  Genitourinary: Negative for hematuria and difficulty urinating.  Musculoskeletal: Negative for arthralgias.  Skin: Negative for rash and wound.  Neurological: Negative for seizures, syncope, weakness and headaches.  Hematological: Negative for adenopathy. Does not bruise/bleed easily.  Psychiatric/Behavioral: Negative for confusion.    Blood pressure 132/76, pulse 64, temperature 98.6 F (37 C), resp. rate 18, height 6\' 1"  (1.854 m), weight 162 lb (73.483 kg).  Physical Exam Physical Exam  Vitals reviewed. Constitutional: He appears well-developed and well-nourished.  Cardiovascular: Normal rate, regular rhythm and normal heart sounds.   Pulmonary/Chest: Effort normal and breath sounds normal. He has no wheezes. He has no rales.    Lymphadenopathy:    He has no cervical adenopathy.    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 I discussed with him a wide local excision with margins as well as a sentinel lymph node biopsy. We discussed the postoperative restrictions. We discussed the performance of a sentinel lymph node biopsy. I'm going to have him get a lymphoscintigram. I will then schedule  him for wide local excision with sentinel node biopsy next week.        Jimmy Plessinger 02/26/2013, 3:18 PM

## 2013-03-05 NOTE — Op Note (Signed)
Preoperative diagnosis: T2  Upper back melanoma Postoperative diagnosis: Same as above Procedure: #1 wide local excision with 2 cm margin of right back melanoma #2 right axillary sentinel lymph node biopsy Surgeon: Dr. Harden Mo Anesthesia: Gen. Estimated blood loss: Minimal Drains: 36 French Blake drain to back Specimens: #1 right back melanoma with margins marked short superior, long lateral, and double deep #2 right axillary sentinel node with counts of 836 Complications: None Sponge needle count correct at end of operation All disposition to recovery or  Indications: This is a 72 year old male who underwent biopsy of a right back lesion that ends up being a malignant melanoma. This is at least a T2 melanoma and we discussed a wide local excision with margins. He underwent a lymphoscintigram preoperatively that showed that this localized to his right axilla. We discussed a sentinel lymph node biopsy at the same time.  Procedure: After informed consent was obtained the patient was taken to the operating room. He was injected with technetium surrounding the melanoma first. He had sequential compression devices. He was administered 2 g of intravenous cefazolin. He was then placed under general anesthesia without complication. He was rolled into the prone position and appropriately padded. He was then prepped and draped in the standard sterile surgical fashion. A surgical timeout was performed.  I measured out a 2 cm margin on each side of his melanoma. I then made an elliptical incision which measured about 15 x 5.5 cm. I carried this out down to its fascia and excised this in its entirety. I marked this as above. I then obtained hemostasis. This was a fairly large area and undermined the skin to be able to close it. I placed a 65 [place] Blake drain and secured this with a 2-0 nylon. I then closed this with 3-0 Vicryl's and 30 external nylon sutures. I placed bacitracin and a sterile  dressing overlying this. He was then rolled into the supine position.  His right axilla was prepped and draped in the standard sterile surgical fashion. Another surgical timeout was performed.  I located the sentinel node with the neoprobe. I then made a 2 cm incision below his axillary hairline. The incision was taken through the axillary fascia. I then identified what appeared to be 2 small lymph nodes with counts listed above. There was really no background radioactivity. Hemostasis was then obtained. I closed the axillary fascia with 2-0 Vicryl. The skin was closed with 3-0 Vicryl and 4-0 Monocryl. A dressing and Steri-Strips were placed on this. He tolerated this well was extubated and transferred to the recovery room in stable condition.

## 2013-03-05 NOTE — Anesthesia Preprocedure Evaluation (Addendum)
Anesthesia Evaluation  Patient identified by MRN, date of birth, ID band Patient awake    Reviewed: Allergy & Precautions, H&P , NPO status , Patient's Chart, lab work & pertinent test results, reviewed documented beta blocker date and time   History of Anesthesia Complications Negative for: history of anesthetic complications  Airway Mallampati: II  Neck ROM: Full    Dental  (+) Dental Advisory Given, Partial Upper and Teeth Intact   Pulmonary former smoker,  breath sounds clear to auscultation        Cardiovascular negative cardio ROS  Rhythm:Regular Rate:Normal     Neuro/Psych negative neurological ROS  negative psych ROS   GI/Hepatic negative GI ROS, Neg liver ROS,   Endo/Other  negative endocrine ROS  Renal/GU negative Renal ROS     Musculoskeletal negative musculoskeletal ROS (+)   Abdominal   Peds  Hematology negative hematology ROS (+)   Anesthesia Other Findings   Reproductive/Obstetrics negative OB ROS                         Anesthesia Physical Anesthesia Plan  ASA: II  Anesthesia Plan: General   Post-op Pain Management:    Induction: Intravenous  Airway Management Planned: Oral ETT  Additional Equipment:   Intra-op Plan:   Post-operative Plan: Extubation in OR  Informed Consent: I have reviewed the patients History and Physical, chart, labs and discussed the procedure including the risks, benefits and alternatives for the proposed anesthesia with the patient or authorized representative who has indicated his/her understanding and acceptance.   Dental advisory given  Plan Discussed with: CRNA and Anesthesiologist  Anesthesia Plan Comments:         Anesthesia Quick Evaluation

## 2013-03-06 ENCOUNTER — Encounter (HOSPITAL_COMMUNITY): Payer: Self-pay | Admitting: General Surgery

## 2013-03-09 ENCOUNTER — Ambulatory Visit (INDEPENDENT_AMBULATORY_CARE_PROVIDER_SITE_OTHER): Payer: 59

## 2013-03-09 ENCOUNTER — Telehealth (INDEPENDENT_AMBULATORY_CARE_PROVIDER_SITE_OTHER): Payer: Self-pay | Admitting: *Deleted

## 2013-03-09 DIAGNOSIS — C4359 Malignant melanoma of other part of trunk: Secondary | ICD-10-CM

## 2013-03-09 NOTE — Telephone Encounter (Signed)
Wife called to state that patient has never had more than 30cc in his drain.  Wife asked to schedule to have drain removed.  Appt made for nurse only visit this morning.

## 2013-03-09 NOTE — Progress Notes (Signed)
The pt came in for nurse only to have drain removed from the back melanoma that was excised on 03/05/13. The bandage was removed from the drain and the incision. The incision looks really good with sutures that I just left a lone b/c it's not time for them to come out per Dr Dwain Sarna. I clipped the suture from the drain and I milked the drain before removing it. I took the drain out of the pt and he tolerated this well. I did recover the melanoma incision and the area where the drain was removed. The pt was asking about taking a shower and I advised pt that he could take a shower tomorrow. The pt can remove all the bandages and get in the shower. I did ask Dr Carolynne Edouard if taking a shower was ok and he said yes. The pt has a f/u appt with Dr Dwain Sarna.

## 2013-03-11 ENCOUNTER — Telehealth (INDEPENDENT_AMBULATORY_CARE_PROVIDER_SITE_OTHER): Payer: Self-pay

## 2013-03-11 NOTE — Telephone Encounter (Signed)
Pt returned my call. I notified him of the good news that there in no more melanoma it was all removed with clear margins and no lymph node involvement per Dr Dwain Sarna. The pt has a f/u appt with Dr Dwain Sarna next week.

## 2013-03-11 NOTE — Telephone Encounter (Signed)
LMOM for pt to call me back.

## 2013-03-20 ENCOUNTER — Encounter (INDEPENDENT_AMBULATORY_CARE_PROVIDER_SITE_OTHER): Payer: Self-pay | Admitting: General Surgery

## 2013-03-20 ENCOUNTER — Ambulatory Visit (INDEPENDENT_AMBULATORY_CARE_PROVIDER_SITE_OTHER): Payer: Medicare Other | Admitting: General Surgery

## 2013-03-20 VITALS — BP 130/64 | HR 70 | Resp 18 | Ht 73.0 in | Wt 165.0 lb

## 2013-03-20 DIAGNOSIS — Z09 Encounter for follow-up examination after completed treatment for conditions other than malignant neoplasm: Secondary | ICD-10-CM

## 2013-03-20 NOTE — Progress Notes (Signed)
Subjective:     Patient ID: Stephen [name], male   DOB: 03/03/1941, 72 y.o.   MRN: [id number]  HPI 72 yom s/p wle back melanoma and right ax snbx for t2ano melanoma.  I left drain which came out last week.  He is doing well without complaints  Review of Systems     Objective:   Physical Exam Both incisions clean without infection    Assessment:     Stage 1b melanoma s/p wle     Plan:     I removed sutures and placed steristrips.  Discussed how these will come off.  He can increase activity.  Will start massaging axillary incision. I will see back as needed.  He will see Dr. Irene Limbo next week for other lesions.

## 2013-03-31 ENCOUNTER — Other Ambulatory Visit: Payer: Self-pay | Admitting: Dermatology

## 2013-05-26 ENCOUNTER — Encounter: Payer: Self-pay | Admitting: Family Medicine

## 2013-05-26 ENCOUNTER — Ambulatory Visit (INDEPENDENT_AMBULATORY_CARE_PROVIDER_SITE_OTHER): Payer: Medicare Other | Admitting: Family Medicine

## 2013-05-26 VITALS — BP 124/71 | HR 60 | Ht 73.0 in | Wt 158.5 lb

## 2013-05-26 DIAGNOSIS — R413 Other amnesia: Secondary | ICD-10-CM

## 2013-05-26 DIAGNOSIS — R634 Abnormal weight loss: Secondary | ICD-10-CM

## 2013-05-26 DIAGNOSIS — Z0000 Encounter for general adult medical examination without abnormal findings: Secondary | ICD-10-CM | POA: Insufficient documentation

## 2013-05-26 DIAGNOSIS — C439 Malignant melanoma of skin, unspecified: Secondary | ICD-10-CM

## 2013-05-26 NOTE — Patient Instructions (Addendum)
It was a pleasure to meet you today.   Your exam is normal.   Return to see Dr Leveda Anna in one year or sooner if you have any problems.

## 2013-05-26 NOTE — Assessment & Plan Note (Signed)
No symptoms of metastasis

## 2013-05-26 NOTE — Progress Notes (Signed)
  Subjective:    Patient ID: Stephen [name], male    DOB: August 02, 1941, 72 y.o.   MRN: [id number]  HPI Here for annual check-up. His memory seems about the same as last year. It's not causing him any problems.  Stress - wife is being treated for Multiple Myeloma and has finished chemo and radiation with an autologous bone marrow transplant planned at [facility].   Review of Systems  Constitutional: Negative for fever, appetite change and fatigue.       Exercising regularly, swims 3 x weekly Wife has him following a healthy diet  Eyes:       Wears glasses only for reading  Respiratory: Negative for cough.   Cardiovascular: Negative for chest pain.  Gastrointestinal: Positive for diarrhea. Negative for abdominal pain, constipation and blood in stool.  Genitourinary: Negative for dysuria, urgency and frequency.  Musculoskeletal: Negative for arthralgias.  Psychiatric/Behavioral: Negative for sleep disturbance.       Objective:   Physical Exam        Assessment & Plan:

## 2013-05-26 NOTE — Assessment & Plan Note (Signed)
He is back down to his weight of 2 summers ago.I applauded him for his exercise to keep muscle on, but recommended that he not loose further weight.

## 2013-05-26 NOTE — Assessment & Plan Note (Signed)
No apparent progression Repeat MMSE next year.

## 2013-05-26 NOTE — Assessment & Plan Note (Signed)
We discussed PSA testing pros and cons and he decided not to have this.

## 2013-06-30 ENCOUNTER — Other Ambulatory Visit: Payer: Self-pay | Admitting: Dermatology

## 2013-09-23 ENCOUNTER — Ambulatory Visit (INDEPENDENT_AMBULATORY_CARE_PROVIDER_SITE_OTHER): Payer: Medicare Other | Admitting: Family Medicine

## 2013-09-23 ENCOUNTER — Encounter: Payer: Self-pay | Admitting: Family Medicine

## 2013-09-23 VITALS — BP 129/57 | HR 52 | Temp 98.1°F | Wt 162.2 lb

## 2013-09-23 DIAGNOSIS — Z23 Encounter for immunization: Secondary | ICD-10-CM

## 2013-09-23 DIAGNOSIS — Z13220 Encounter for screening for lipoid disorders: Secondary | ICD-10-CM

## 2013-09-23 DIAGNOSIS — R1013 Epigastric pain: Secondary | ICD-10-CM

## 2013-09-23 LAB — CBC
HCT: 41.3 % (ref 39.0–52.0)
MCH: 30.9 pg (ref 26.0–34.0)
MCHC: 33.4 g/dL (ref 30.0–36.0)
MCV: 92.6 fL (ref 78.0–100.0)
Platelets: 121 10*3/uL — ABNORMAL LOW (ref 150–400)
RDW: 13.3 % (ref 11.5–15.5)
WBC: 3.9 10*3/uL — ABNORMAL LOW (ref 4.0–10.5)

## 2013-09-23 LAB — POCT H PYLORI SCREEN: H Pylori Screen, POC: NEGATIVE

## 2013-09-23 NOTE — Patient Instructions (Signed)
I am optimistic, but not certain that this will turn out to be nothing important. I will call with blood work results. You got a flu shot today, which brings you up to date on you health maintenance.

## 2013-09-24 ENCOUNTER — Encounter: Payer: Self-pay | Admitting: Family Medicine

## 2013-09-24 LAB — COMPLETE METABOLIC PANEL WITH GFR
ALT: 18 U/L (ref 0–53)
AST: 20 U/L (ref 0–37)
Alkaline Phosphatase: 51 U/L (ref 39–117)
BUN: 22 mg/dL (ref 6–23)
Calcium: 9.1 mg/dL (ref 8.4–10.5)
Chloride: 104 mEq/L (ref 96–112)
Creat: 0.97 mg/dL (ref 0.50–1.35)
Total Bilirubin: 0.5 mg/dL (ref 0.3–1.2)

## 2013-09-24 LAB — LIPID PANEL
Cholesterol: 160 mg/dL (ref 0–200)
HDL: 57 mg/dL (ref >39)
Total CHOL/HDL Ratio: 2.8 Ratio

## 2013-09-24 LAB — LIPASE: Lipase: 16 U/L (ref 0–75)

## 2013-09-24 NOTE — Assessment & Plan Note (Signed)
Due for a check.

## 2013-09-24 NOTE — Progress Notes (Signed)
  Subjective:    Patient ID: Stephen [name], male    DOB: Sep 11, 1941, 72 y.o.   MRN: [id number]  HPI Main complain is two months of epigastric discomfort.  Does not lateralize.  Not taking any offending meds, specifically denies NSAIDS.  No hx of ulcer.  Denies bleeding, wt loss, vomiting, bowel changes or appetite changes.  Pattern, no clear pattern discernable.  Maybe more frequent 1-2 hours post prandial.  Never awoken at night.  Has tried some intermittent OTC Pepcid which did seem to help.  Discomfort still occurs, but it is getting less.   No underlying illness to speak of.  Does have a hx of melanoma removal with no evidence of metastatic disease.    Review of Systems     Objective:   Physical Exam Lungs clear Cardiac RRR without m or g Abd.  No organomegally masses or tenderness.       Assessment & Plan:

## 2013-09-24 NOTE — Assessment & Plan Note (Signed)
Since improving, doubt sig pathology.  Will check basic labs but hold off on EGD or advanced imagining unless bad labs or progressive sx.

## 2013-11-26 DIAGNOSIS — D0359 Melanoma in situ of other part of trunk: Secondary | ICD-10-CM

## 2013-11-26 HISTORY — DX: Melanoma in situ of other part of trunk: D03.59

## 2013-12-04 ENCOUNTER — Ambulatory Visit (INDEPENDENT_AMBULATORY_CARE_PROVIDER_SITE_OTHER): Payer: Medicare Other | Admitting: Internal Medicine

## 2013-12-04 DIAGNOSIS — Z7189 Other specified counseling: Secondary | ICD-10-CM

## 2013-12-04 DIAGNOSIS — Z7184 Encounter for health counseling related to travel: Secondary | ICD-10-CM

## 2013-12-04 NOTE — Progress Notes (Signed)
  Subjective:    DEJON LUKAS is a 73 y.o. male who presents to the Infectious Disease clinic for travel consultation. Planned departure date: January           Planned return date: 3 weeks Countries of travel: [place] Areas in country: travel around   Accommodations: hotel Purpose of travel: vacation Prior travel out of [place]: yes Currently ill / Fever: no History of liver or kidney disease: no  Data Review:  Patient without medications and no medical problems   Review of Systems A comprehensive review of systems was negative.    Objective:    n/a    Assessment:    No contraindications to travel. none      Plan:    Issues discussed: future shots, insect-borne illnesses, malaria, MVA safety, rabies, safe food/water, traveler's diarrhea, website/handouts for more information, what to do if ill upon return, what to do if ill while there and Yellow Fever. Immunizations recommended: Td. Malaria prophylaxis: not indicated Traveler's diarrhea prophylaxis: has a supply.

## 2015-03-08 ENCOUNTER — Other Ambulatory Visit: Payer: Self-pay | Admitting: Dermatology

## 2015-09-15 ENCOUNTER — Ambulatory Visit (INDEPENDENT_AMBULATORY_CARE_PROVIDER_SITE_OTHER): Payer: Medicare Other | Admitting: Family Medicine

## 2015-09-15 ENCOUNTER — Encounter: Payer: Self-pay | Admitting: Family Medicine

## 2015-09-15 VITALS — BP 110/80 | HR 60 | Temp 98.1°F | Ht 73.0 in | Wt 155.0 lb

## 2015-09-15 DIAGNOSIS — Z23 Encounter for immunization: Secondary | ICD-10-CM

## 2015-09-15 DIAGNOSIS — R300 Dysuria: Secondary | ICD-10-CM

## 2015-09-15 DIAGNOSIS — N390 Urinary tract infection, site not specified: Secondary | ICD-10-CM | POA: Insufficient documentation

## 2015-09-15 LAB — POCT URINALYSIS DIPSTICK
BILIRUBIN UA: NEGATIVE
GLUCOSE UA: NEGATIVE
Ketones, UA: NEGATIVE
Nitrite, UA: POSITIVE
SPEC GRAV UA: 1.02
UROBILINOGEN UA: 0.2
pH, UA: 5.5

## 2015-09-15 LAB — POCT UA - MICROSCOPIC ONLY

## 2015-09-15 MED ORDER — CEPHALEXIN 500 MG PO CAPS
500.0000 mg | ORAL_CAPSULE | Freq: Three times a day (TID) | ORAL | Status: DC
Start: 2015-09-15 — End: 2017-05-28

## 2015-09-15 NOTE — Patient Instructions (Addendum)
I will call Monday with the culture results. Send me a My Chart message in about a month with a report on how completely you are peeing.  We need to decide whether it is worth starting you on prostate medications long term.

## 2015-09-15 NOTE — Assessment & Plan Note (Addendum)
Sounds more like true UTI rather than prostatitis. Keflex and culture.  No further workup unless repeated infection or fails to clear.

## 2015-09-16 NOTE — Progress Notes (Signed)
   Subjective:    Patient ID: Stephen [name], male    DOB: Mar 30, 1941, 74 y.o.   MRN: [id number]  HPI One week history of urgency and frequency.  No fever.  Not much pain.  No flank pain.  Feels generally well except cannot exercise as much as normal  No previous urinary obstructive symptoms beyond a slow weakening of the stream.  Feels like he completely empties bladder.  No previous prostatitis.  Monogamous, not at risk for STD    Review of Systems     Objective:   Physical Exam  Mild suprapubic tenderness No CVA tenderness Prostate MILD enlargement.  Not particularly tender.        Assessment & Plan:

## 2015-09-17 LAB — URINE CULTURE

## 2015-09-24 ENCOUNTER — Encounter: Payer: Self-pay | Admitting: Family Medicine

## 2015-09-25 ENCOUNTER — Encounter: Payer: Self-pay | Admitting: Family Medicine

## 2015-09-25 DIAGNOSIS — R310 Gross hematuria: Secondary | ICD-10-CM

## 2015-09-26 DIAGNOSIS — R310 Gross hematuria: Secondary | ICD-10-CM | POA: Insufficient documentation

## 2015-09-26 NOTE — Telephone Encounter (Signed)
Entered urology referral for hematuria.

## 2015-09-27 ENCOUNTER — Telehealth: Payer: Self-pay | Admitting: Student

## 2015-09-27 ENCOUNTER — Telehealth: Payer: Self-pay | Admitting: Family Medicine

## 2015-09-27 ENCOUNTER — Encounter: Payer: Self-pay | Admitting: Family Medicine

## 2015-09-27 ENCOUNTER — Ambulatory Visit (INDEPENDENT_AMBULATORY_CARE_PROVIDER_SITE_OTHER): Payer: Medicare Other | Admitting: Internal Medicine

## 2015-09-27 VITALS — BP 94/48 | HR 64 | Temp 98.3°F | Resp 18 | Ht 72.0 in | Wt 157.0 lb

## 2015-09-27 DIAGNOSIS — R319 Hematuria, unspecified: Secondary | ICD-10-CM | POA: Diagnosis not present

## 2015-09-27 DIAGNOSIS — R300 Dysuria: Secondary | ICD-10-CM

## 2015-09-27 DIAGNOSIS — N10 Acute tubulo-interstitial nephritis: Secondary | ICD-10-CM

## 2015-09-27 LAB — POCT CBC
Granulocyte percent: 94.5 %G — AB (ref 37–80)
HCT, POC: 38.4 % — AB (ref 43.5–53.7)
HEMOGLOBIN: 13.4 g/dL — AB (ref 14.1–18.1)
LYMPH, POC: 0.7 (ref 0.6–3.4)
MCH, POC: 32.1 pg — AB (ref 27–31.2)
MCHC: 34.9 g/dL (ref 31.8–35.4)
MCV: 92 fL (ref 80–97)
MID (CBC): 0.1 (ref 0–0.9)
MPV: 7.5 fL (ref 0–99.8)
POC Granulocyte: 15.3 — AB (ref 2–6.9)
POC LYMPH PERCENT: 4.6 %L — AB (ref 10–50)
POC MID %: 0.9 % (ref 0–12)
Platelet Count, POC: 241 10*3/uL (ref 142–424)
RBC: 4.17 M/uL — AB (ref 4.69–6.13)
RDW, POC: 13.1 %
WBC: 16.2 10*3/uL — AB (ref 4.6–10.2)

## 2015-09-27 LAB — POC MICROSCOPIC URINALYSIS (UMFC): MUCUS RE: ABSENT

## 2015-09-27 LAB — POCT URINALYSIS DIP (MANUAL ENTRY)
Bilirubin, UA: NEGATIVE
Glucose, UA: NEGATIVE
Ketones, POC UA: NEGATIVE
Nitrite, UA: POSITIVE — AB
SPEC GRAV UA: 1.02
UROBILINOGEN UA: 1
pH, UA: 6

## 2015-09-27 MED ORDER — SULFAMETHOXAZOLE-TRIMETHOPRIM 800-160 MG PO TABS: 1.0000 | ORAL_TABLET | Freq: Two times a day (BID) | ORAL | Status: DC

## 2015-09-27 MED ORDER — CEFTRIAXONE SODIUM 1 G IJ SOLR
1.0000 g | Freq: Once | INTRAMUSCULAR | Status: AC
  Administered 2015-09-27: 1 g via INTRAMUSCULAR

## 2015-09-27 NOTE — Telephone Encounter (Signed)
Would like to talk to nurse--had UTI.  After finishing antibotic, symptons came back. Dr Andria Frames suggested pt see urlogist.  [facility] will not return the call. Fever went to 103 last night.  When feverish, he shakes. What should she do since urology isnt returning the call

## 2015-09-27 NOTE — Progress Notes (Signed)
Subjective:    Patient ID: Stephen [name], male    DOB: [birth year], 74 y.o.   MRN: [id number]  Chief Complaint  Patient presents with  . Hematuria    4 days ago  . Fever    104 today and 103 last night  . Dysuria    4 days ago  . Chills    HPI HPI Comments: Stephen [name] is a 74 y.o. male who presents to [facility] complaining of hematuria.  Fever of 104 last night and again this afternoon, with chills. Pt has a hx of e coli, sensitive to all, treated with 7 days of keflex begining 08/2015. At that poin the had had intermittent sx for 10 days including fever with frequency, and some hematuria. He has now had intermittent fever for 3-4 days after finishing antibiotics. Follow up with urology was ordered by Dr. Andria Frames , but has not yet been scheduled. No prior urinary or prostate problems.   Patient Active Problem List   Diagnosis Date Noted  . Hematuria, gross 09/26/2015  . Urinary tract infection, site not specified 09/15/2015  . Abdominal pain, epigastric 09/23/2013  . Screening cholesterol level 09/23/2013  . Loss of weight 05/26/2013  . Health maintenance examination 05/26/2013  . Melanoma of skin, site unspecified 02/27/2013  . Short-term memory loss 03/06/2012     Review of Systems  Constitutional: Positive for fever and chills.  Genitourinary: Positive for frequency and hematuria.  recent fatigue     Objective:   Physical Exam  Constitutional: He is oriented to person, place, and time. He appears well-developed and well-nourished. No distress.  HENT:  Head: Normocephalic and atraumatic.  Eyes: EOM are normal. Pupils are equal, round, and reactive to light.  Neck: Neck supple.  Cardiovascular: Normal rate.   Pulmonary/Chest: Effort normal.  Abdominal: There is no tenderness.  No CVA tenderness. No abdominal tenderness, and acute distress.   Neurological: He is alert and oriented to person, place, and time. No cranial nerve deficit.    Skin: Skin is warm and dry.  Psychiatric: He has a normal mood and affect. His behavior is normal.  Nursing note and vitals reviewed.   BP 94/48 mmHg  Pulse 64  Temp(Src) 98.3 F (36.8 C) (Oral)  Resp 18  Ht 6' (1.829 m)  Wt 157 lb (71.215 kg)  BMI 21.29 kg/m2  SpO2 98% Results for orders placed or performed in visit on 09/27/15  POCT Microscopic Urinalysis ([facility])  Result Value Ref Range   WBC,UR,HPF,POC Many (A) None WBC/hpf   RBC,UR,HPF,POC Moderate (A) None RBC/hpf   Bacteria Many (A) None, Too numerous to count   Mucus Absent Absent   Epithelial Cells, UR Per Microscopy None None, Too numerous to count cells/hpf  POCT urinalysis dipstick  Result Value Ref Range   Color, UA yellow yellow   Clarity, UA hazy (A) clear   Glucose, UA negative negative   Bilirubin, UA negative negative   Ketones, POC UA negative negative   Spec Grav, UA 1.020    Blood, UA large (A) negative   pH, UA 6.0    Protein Ur, POC >=300 (A) negative   Urobilinogen, UA 1.0    Nitrite, UA Positive (A) Negative   Leukocytes, UA moderate (2+) (A) Negative  POCT CBC  Result Value Ref Range   WBC 16.2 (A) 4.6 - 10.2 K/uL   Lymph, poc 0.7 0.6 - 3.4   POC LYMPH PERCENT 4.6 (A) 10 - 50 %  L   MID (cbc) 0.1 0 - 0.9   POC MID % 0.9 0 - 12 %M   POC Granulocyte 15.3 (A) 2 - 6.9   Granulocyte percent 94.5 (A) 37 - 80 %G   RBC 4.17 (A) 4.69 - 6.13 M/uL   Hemoglobin 13.4 (A) 14.1 - 18.1 g/dL   HCT, POC 38.4 (A) 43.5 - 53.7 %   MCV 92.0 80 - 97 fL   MCH, POC 32.1 (A) 27 - 31.2 pg   MCHC 34.9 31.8 - 35.4 g/dL   RDW, POC 13.1 %   Platelet Count, POC 241 142 - 424 K/uL   MPV 7.5 0 - 99.8 fL       Assessment & Plan:  Acute pyelonephritis w/ Dysuria , Hematuria, fever after ecoli relapse  - Plan: cefTRIAXone (ROCEPHIN) injection 1 g followed by septraDS bid 10days - Comprehensive metabolic panel, Urine culture - FU uro as planned

## 2015-09-27 NOTE — Telephone Encounter (Signed)
Pt's wife reports low grade fevers to 100.2 over the last day with some shaking chills. She just checked his temperature and states it was 103.  She has been giving him tylenol which has helped his fever. She notes that he was recently treated for a UTI and the dysuria he was having has resolved. She denies nausea/emesis, diarrhea/SOB, abd pain, back pain.  She was counseled to recheck his temperature and if it remains elevated and not relieved with tylenol he should present to the ED. He does have a urology appointment tomorrow 11/1 and she was strongly counseled to present to this appointment All questions were answered their satifaction

## 2015-09-27 NOTE — Telephone Encounter (Signed)
Pt wife called again

## 2015-09-28 LAB — COMPREHENSIVE METABOLIC PANEL
ALK PHOS: 96 U/L (ref 40–115)
ALT: 24 U/L (ref 9–46)
AST: 23 U/L (ref 10–35)
Albumin: 3.3 g/dL — ABNORMAL LOW (ref 3.6–5.1)
BILIRUBIN TOTAL: 0.7 mg/dL (ref 0.2–1.2)
BUN: 28 mg/dL — AB (ref 7–25)
CO2: 27 mmol/L (ref 20–31)
CREATININE: 1.36 mg/dL — AB (ref 0.70–1.18)
Calcium: 8.4 mg/dL — ABNORMAL LOW (ref 8.6–10.3)
Chloride: 99 mmol/L (ref 98–110)
GLUCOSE: 131 mg/dL — AB (ref 65–99)
POTASSIUM: 4.1 mmol/L (ref 3.5–5.3)
SODIUM: 135 mmol/L (ref 135–146)
TOTAL PROTEIN: 6 g/dL — AB (ref 6.1–8.1)

## 2015-09-28 NOTE — Telephone Encounter (Signed)
Noted and I did review the Urgent Care note.

## 2015-09-28 NOTE — Telephone Encounter (Signed)
We will look into it

## 2015-09-28 NOTE — Telephone Encounter (Signed)
Contacted pt and he stated that he was unable to get appointment and ended up going to the urgent care on 11/01 in the evening.  Stated he saw Dr. Sonia Baller and he started him on a 10 day course of sulfa drugs as well as receiving a shot at the office.  Stated that other than feeling groggy he is feeling better. He also stated that he heard from [facility] and that he has an appointment on 12/15. I told him that I would send a message to the PCP so that he would be informed of what was going on. Katharina Caper, Hulda Reddix D, CMA

## 2015-09-29 ENCOUNTER — Telehealth: Payer: Self-pay | Admitting: Internal Medicine

## 2015-09-30 LAB — URINE CULTURE

## 2015-10-03 NOTE — Telephone Encounter (Signed)
.  .  .        xx

## 2017-05-28 ENCOUNTER — Ambulatory Visit (INDEPENDENT_AMBULATORY_CARE_PROVIDER_SITE_OTHER): Payer: Medicare Other | Admitting: *Deleted

## 2017-05-28 ENCOUNTER — Encounter: Payer: Self-pay | Admitting: *Deleted

## 2017-05-28 VITALS — BP 130/60 | HR 59 | Temp 98.6°F | Ht 73.0 in | Wt 156.6 lb

## 2017-05-28 DIAGNOSIS — Z0000 Encounter for general adult medical examination without abnormal findings: Secondary | ICD-10-CM

## 2017-05-28 NOTE — Patient Instructions (Signed)
Stephen [name],  Thank you for taking time to come for yourMedicare Wellness Visit. I appreciate your ongoing commitment to your health goals. Please review the following plan we discussed and let me know if I can assist you in the future.   These are the goals we discussed:  Goals    . Maintaining current level of physical activity and weight (pt-stated)        Health Maintenance, Male A healthy lifestyle and preventive care is important for your health and wellness. Ask your health care provider about what schedule of regular examinations is right for you. What should I know about weight and diet? Eat a Healthy Diet  Eat plenty of vegetables, fruits, whole grains, low-fat dairy products, and lean protein.  Do not eat a lot of foods high in solid fats, added sugars, or salt.  Maintain a Healthy Weight Regular exercise can help you achieve or maintain a healthy weight. You should:  Do at least 150 minutes of exercise each week. The exercise should increase your heart rate and make you sweat (moderate-intensity exercise).  Do strength-training exercises at least twice a week.  Watch Your Levels of Cholesterol and Blood Lipids  Have your blood tested for lipids and cholesterol every 5 years starting at 75 years of age. If you are at high risk for heart disease, you should start having your blood tested when you are 75 years old. You may need to have your cholesterol levels checked more often if: ? Your lipid or cholesterol levels are high. ? You are older than 75 years of age. ? You are at high risk for heart disease.  What should I know about cancer screening? Many types of cancers can be detected early and may often be prevented. Lung Cancer  You should be screened every year for lung cancer if: ? You are a current smoker who has smoked for at least 30 years. ? You are a former smoker who has quit within the past 15 years.  Talk to your health care provider about your  screening options, when you should start screening, and how often you should be screened.  Colorectal Cancer  Routine colorectal cancer screening usually begins at 75 years of age and should be repeated every 5-10 years until you are 75 years old. You may need to be screened more often if early forms of precancerous polyps or small growths are found. Your health care provider may recommend screening at an earlier age if you have risk factors for colon cancer.  Your health care provider may recommend using home test kits to check for hidden blood in the stool.  A small camera at the end of a tube can be used to examine your colon (sigmoidoscopy or colonoscopy). This checks for the earliest forms of colorectal cancer.  Prostate and Testicular Cancer  Depending on your age and overall health, your health care provider may do certain tests to screen for prostate and testicular cancer.  Talk to your health care provider about any symptoms or concerns you have about testicular or prostate cancer.  Skin Cancer  Check your skin from head to toe regularly.  Tell your health care provider about any new moles or changes in moles, especially if: ? There is a change in a mole's size, shape, or color. ? You have a mole that is larger than a pencil eraser.  Always use sunscreen. Apply sunscreen liberally and repeat throughout the day.  Protect yourself by wearing long sleeves,  pants, a wide-brimmed hat, and sunglasses when outside.  What should I know about heart disease, diabetes, and high blood pressure?  If you are 29-64 years of age, have your blood pressure checked every 3-5 years. If you are 59 years of age or older, have your blood pressure checked every year. You should have your blood pressure measured twice-once when you are at a hospital or clinic, and once when you are not at a hospital or clinic. Record the average of the two measurements. To check your blood pressure when you are not at  a hospital or clinic, you can use: ? An automated blood pressure machine at a pharmacy. ? A home blood pressure monitor.  Talk to your health care provider about your target blood pressure.  If you are between 31-57 years old, ask your health care provider if you should take aspirin to prevent heart disease.  Have regular diabetes screenings by checking your fasting blood sugar level. ? If you are at a normal weight and have a low risk for diabetes, have this test once every three years after the age of 66. ? If you are overweight and have a high risk for diabetes, consider being tested at a younger age or more often.  A one-time screening for abdominal aortic aneurysm (AAA) by ultrasound is recommended for men aged 21-75 years who are current or former smokers. What should I know about preventing infection? Hepatitis B If you have a higher risk for hepatitis B, you should be screened for this virus. Talk with your health care provider to find out if you are at risk for hepatitis B infection. Hepatitis C Blood testing is recommended for:  Everyone born from [birth year] through 1964.  Anyone with known risk factors for hepatitis C.  Sexually Transmitted Diseases (STDs)  You should be screened each year for STDs including gonorrhea and chlamydia if: ? You are sexually active and are younger than 75 years of age. ? You are older than 75 years of age and your health care provider tells you that you are at risk for this type of infection. ? Your sexual activity has changed since you were last screened and you are at an increased risk for chlamydia or gonorrhea. Ask your health care provider if you are at risk.  Talk with your health care provider about whether you are at high risk of being infected with HIV. Your health care provider may recommend a prescription medicine to help prevent HIV infection.  What else can I do?  Schedule regular health, dental, and eye exams.  Stay current with  your vaccines (immunizations).  Do not use any tobacco products, such as cigarettes, chewing tobacco, and e-cigarettes. If you need help quitting, ask your health care provider.  Limit alcohol intake to no more than 2 drinks per day. One drink equals 12 ounces of beer, 5 ounces of wine, or 1 ounces of hard liquor.  Do not use street drugs.  Do not share needles.  Ask your health care provider for help if you need support or information about quitting drugs.  Tell your health care provider if you often feel depressed.  Tell your health care provider if you have ever been abused or do not feel safe at home. This information is not intended to replace advice given to you by your health care provider. Make sure you discuss any questions you have with your health care provider. Document Released: 05/10/2008 Document Revised: 07/11/2016 Document Reviewed: 08/16/2015 Elsevier  Interactive Patient Education  2018 [employer] Prevention in the Home Falls can cause injuries and can affect people from all age groups. There are many simple things that you can do to make your home safe and to help prevent falls. What can I do on the outside of my home?  Regularly repair the edges of walkways and driveways and fix any cracks.  Remove high doorway thresholds.  Trim any shrubbery on the main path into your home.  Use bright outdoor lighting.  Clear walkways of debris and clutter, including tools and rocks.  Regularly check that handrails are securely fastened and in good repair. Both sides of any steps should have handrails.  Install guardrails along the edges of any raised decks or porches.  Have leaves, snow, and ice cleared regularly.  Use sand or salt on walkways during winter months.  In the garage, clean up any spills right away, including grease or oil spills. What can I do in the bathroom?  Use night lights.  Install grab bars by the toilet and in the tub and shower. Do  not use towel bars as grab bars.  Use non-skid mats or decals on the floor of the tub or shower.  If you need to sit down while you are in the shower, use a plastic, non-slip stool.  Keep the floor dry. Immediately clean up any water that spills on the floor.  Remove soap buildup in the tub or shower on a regular basis.  Attach bath mats securely with double-sided non-slip rug tape.  Remove throw rugs and other tripping hazards from the floor. What can I do in the bedroom?  Use night lights.  Make sure that a bedside light is easy to reach.  Do not use oversized bedding that drapes onto the floor.  Have a firm chair that has side arms to use for getting dressed.  Remove throw rugs and other tripping hazards from the floor. What can I do in the kitchen?  Clean up any spills right away.  Avoid walking on wet floors.  Place frequently used items in easy-to-reach places.  If you need to reach for something above you, use a sturdy step stool that has a grab bar.  Keep electrical cables out of the way.  Do not use floor polish or wax that makes floors slippery. If you have to use wax, make sure that it is non-skid floor wax.  Remove throw rugs and other tripping hazards from the floor. What can I do in the stairways?  Do not leave any items on the stairs.  Make sure that there are handrails on both sides of the stairs. Fix handrails that are broken or loose. Make sure that handrails are as long as the stairways.  Check any carpeting to make sure that it is firmly attached to the stairs. Fix any carpet that is loose or worn.  Avoid having throw rugs at the top or bottom of stairways, or secure the rugs with carpet tape to prevent them from moving.  Make sure that you have a light switch at the top of the stairs and the bottom of the stairs. If you do not have them, have them installed. What are some other fall prevention tips?  Wear closed-toe shoes that fit well and  support your feet. Wear shoes that have rubber soles or low heels.  When you use a stepladder, make sure that it is completely opened and that the sides are firmly locked. Have  someone hold the ladder while you are using it. Do not climb a closed stepladder.  Add color or contrast paint or tape to grab bars and handrails in your home. Place contrasting color strips on the first and last steps.  Use mobility aids as needed, such as canes, walkers, scooters, and crutches.  Turn on lights if it is dark. Replace any light bulbs that burn out.  Set up furniture so that there are clear paths. Keep the furniture in the same spot.  Fix any uneven floor surfaces.  Choose a carpet design that does not hide the edge of steps of a stairway.  Be aware of any and all pets.  Review your medicines with your healthcare provider. Some medicines can cause dizziness or changes in blood pressure, which increase your risk of falling. Talk with your health care provider about other ways that you can decrease your risk of falls. This may include working with a physical therapist or trainer to improve your strength, balance, and endurance. This information is not intended to replace advice given to you by your health care provider. Make sure you discuss any questions you have with your health care provider. Document Released: 11/02/2002 Document Revised: 04/10/2016 Document Reviewed: 12/17/2014 Elsevier Interactive Patient Education  2017 [employer].

## 2017-05-28 NOTE — Progress Notes (Signed)
Patient ID: Stephen [name], male   DOB: [birth year], 76 y.o.   MRN: [id number] I have reviewed this visit and discussed with Howell Rucks, RN, BSN, and agree with her documentation.

## 2017-05-28 NOTE — Progress Notes (Signed)
Subjective:   Stephen [name] is a 76 y.o. male who presents with wife for an Initial Medicare Annual Wellness Visit.  Cardiac Risk Factors include: advanced age (>68men, >29 women);male gender    Objective:    Today's Vitals   05/28/17 1453  BP: 130/60  Pulse: (!) 59  Temp: 98.6 F (37 C)  SpO2: 96%  Weight: 156 lb 9.6 oz (71 kg)  Height: 6\' 1"  (1.854 m)  PainSc: 0-No pain   Body mass index is 20.66 kg/m.  Current Medications (verified) Outpatient Encounter Prescriptions as of 05/28/2017  Medication Sig  . acetaminophen (TYLENOL) 500 MG tablet Take 500 mg by mouth every 6 (six) hours as needed.  . Calcium Carb-Cholecalciferol (CALCIUM-VITAMIN D) 500-200 MG-UNIT tablet Take 1 tablet by mouth daily.  . hydrocortisone valerate cream (WESTCORT) 0.2 % Apply 1 application topically daily as needed (dry skin).   . [DISCONTINUED] FLUZONE HIGH-DOSE 0.5 ML SUSY TO BE ADMINISTERED BY PHARMACIST FOR IMMUNIZATION  . [DISCONTINUED] cephALEXin (KEFLEX) 500 MG capsule Take 1 capsule (500 mg total) by mouth 3 (three) times daily. (Patient not taking: Reported on 09/27/2015)  . [DISCONTINUED] sulfamethoxazole-trimethoprim (BACTRIM DS,SEPTRA DS) 800-160 MG tablet Take 1 tablet by mouth 2 (two) times daily. (Patient not taking: Reported on 05/28/2017)   No facility-administered encounter medications on file as of 05/28/2017.     Allergies (verified) Naproxen   History: Past Medical History:  Diagnosis Date  . Medical history non-contributory   . 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 AXILLARY SENTINEL NODE BIOPSY Right 03/05/2013   Procedure: AXILLARY SENTINEL NODE BIOPSY;  Surgeon: Rolm Bookbinder, MD;  Location: [facility];  Service: General;  Laterality: Right;  . basal /sq cell removal  2014   x-4 times removal  . EXCISION MELANOMA WITH SENTINEL LYMPH NODE BIOPSY Right 03/05/2013   Procedure: WIDE LOCAL EXCISION RIGHT UPPER BACK   MELANOMA WITH RIGHT AXILLARYSENTINEL NODE BIOPSY;  Surgeon: Rolm Bookbinder, MD;  Location: [facility];  Service: General;  Laterality: Right;   Family History  Problem Relation Age of Onset  . Heart disease Mother   . Diabetes Mother        died age   . Arthritis Mother   . Glaucoma Father   . Heart disease Father   . Arthritis Father   . Stroke Father   . Cancer Sister        Lung CA  . Arthritis Brother    Social History   Occupational History  . [place] History Main Topics  . Smoking status: Never Smoker  . Smokeless tobacco: Never Used  . Alcohol use 3.5 oz/week    7 Standard drinks or equivalent per week     Comment: daily  . Drug use: No  . Sexual activity: Yes   Tobacco Counseling Patient has never smoked and has no plans to start.  Activities of Daily Living In your present state of health, do you have any difficulty performing the following activities: 05/28/2017  Hearing? Y  Vision? N  Difficulty concentrating or making decisions? Y  Walking or climbing stairs? N  Dressing or bathing? N  Doing errands, shopping? N  Preparing Food and eating ? N  Using the Toilet? N  In the past six months, have you accidently leaked urine? N  Do you have problems with loss of bowel control? N  Managing your  Medications? N  Managing your Finances? N  Housekeeping or managing your Housekeeping? N  Some recent data might be hidden   Home Safety:  My home has a working smoke alarm:  Yes X 2           My home throw rugs have been fastened down to the floor or removed:  Removed I have a non-slip surface or non-slip mats in the bathtub and shower:  Has grab bar. In process of building handicap shower         All my home's stairs have handrails, including any outdoor stairs  Two level home with handrails inside and 2 level deck outside with handrails              My home's floors, stairs and hallways are free from clutter, wires and  cords:  Yes     I have animals in my home  Yes, 65 lb black lab/boxer mix I wear seatbelts consistently:  Yes   Immunizations and Health Maintenance Immunization History  Administered Date(s) Administered  . Influenza Whole 10/14/2009  . Influenza, High Dose Seasonal PF 08/27/2016  . Influenza,inj,Quad PF,36+ Mos 09/23/2013  . Influenza-Unspecified 09/09/2015  . Pneumococcal Conjugate-13 09/15/2015  . Pneumococcal Polysaccharide-23 10/14/2009  . Td 10/14/2009   There are no preventive care reminders to display for this patient.  Patient Care Team: Zenia Resides, MD as PCP - General (Family Medicine) Barbaraann Cao, OD as Referring Physician (Optometry) Particia Nearing, MD (Dermatology) Mottinger, Sharyn Lull (Dentistry) Clarene Essex, MD as Attending Physician (Gastroenterology)  Indicate any recent Medical Services you may have received from other than [facility] providers in the past year (date may be approximate).    Assessment:   This is a routine wellness examination for [employer].   Hearing/Vision screen  Hearing Screening   Method: Audiometry   125Hz  250Hz  500Hz  1000Hz  2000Hz  3000Hz  4000Hz  6000Hz  8000Hz   Right ear:   Fail Fail Fail  Fail    Left ear:   Fail Fail Fail  Fail     Patient has worn bilateral hearing aids X 12 years  Dietary issues and exercise activities discussed: Current Exercise Habits: Structured exercise class;Home exercise routine ([employer] Silver Sneakers), Type of exercise: strength training/weights;stretching (jogging, swimming), Time (Minutes): 30, Frequency (Times/Week): 2, Weekly Exercise (Minutes/Week): 60, Intensity: Moderate, Exercise limited by: None identified  Goals    . Maintaining current level of physical activity and weight (pt-stated)      Depression Screen PHQ 2/9 Scores 05/28/2017 09/15/2015 09/23/2013 05/26/2013  PHQ - 2 Score 0 0 0 0    Fall Risk Fall Risk  05/28/2017 09/15/2015 09/23/2013 05/26/2013  Falls in the past year? Yes No  No No  Number falls in past yr: 1 - - -  Injury with Fall? Yes - - -  Follow up Falls evaluation completed;Education provided;Falls prevention discussed - - -   TUG Test:  Done in 10 seconds. Falls prevention discussed in detail and literature given.  Cognitive Function: Mini-Cog  Passed with score 4/5  Cognitive Function: MMSE - Mini Mental State Exam 03/06/2012 05/09/2011  Orientation to time 5 5  Orientation to Place 5 5  Registration 3 3  Attention/ Calculation 5 5  Recall 3 3  Language- name 2 objects 2 2  Language- repeat 1 1  Language- follow 3 step command 3 3  Language- read & follow direction 1 1  Write a sentence 1 1  Copy design 1 1  Total score 30 30  Screening Tests Health Maintenance  Topic Date Due  . INFLUENZA VACCINE  06/26/2017  . TETANUS/TDAP  10/15/2019  . COLONOSCOPY  02/06/2020  . PNA vac Low Risk Adult  Completed        Plan:   Patient has had "slight stomach upset" 3 X per week for several months. Thinks it might be gas. Encouraged patient to schedule OV with PCP to discuss. Last OV with PCP 09/15/2015.  I have personally reviewed and noted the following in the patient's chart:   . Medical and social history . Use of alcohol, tobacco or illicit drugs  . Current medications and supplements . Functional ability and status . Nutritional status . Physical activity . Advanced directives . List of other physicians . Hospitalizations, surgeries, and ER visits in previous 12 months . Vitals . Screenings to include cognitive, depression, and falls . Referrals and appointments  In addition, I have reviewed and discussed with patient certain preventive protocols, quality metrics, and best practice recommendations. A written personalized care plan for preventive services as well as general preventive health recommendations were provided to patient.     Velora Heckler, RN   05/28/2017

## 2017-05-30 ENCOUNTER — Encounter: Payer: Self-pay | Admitting: *Deleted

## 2017-09-09 ENCOUNTER — Encounter: Payer: Self-pay | Admitting: Family Medicine

## 2017-12-02 ENCOUNTER — Encounter: Payer: Self-pay | Admitting: Student

## 2017-12-02 ENCOUNTER — Other Ambulatory Visit: Payer: Self-pay

## 2017-12-02 ENCOUNTER — Ambulatory Visit: Payer: Medicare Other | Admitting: Student

## 2017-12-02 VITALS — BP 122/82 | HR 51 | Temp 98.5°F | Ht 73.0 in | Wt 161.0 lb

## 2017-12-02 DIAGNOSIS — R0789 Other chest pain: Secondary | ICD-10-CM | POA: Insufficient documentation

## 2017-12-02 DIAGNOSIS — R001 Bradycardia, unspecified: Secondary | ICD-10-CM

## 2017-12-02 NOTE — Progress Notes (Signed)
Subjective:    Stephen [name] is a 77 y.o. old male here chest pain.  Patient is here with his wife.  HPI Chest pain: yesterday and a week prior. He was sitting on the computer both times. Pain was over his left chest. Lasted about 5 minutes and resolved spontaneously most times. He ignored the first one and wife encouraged him to see a doctor the second time. Last chest pain was yesterday.  He is currently chest pain-free.  Pain was pressure like per wife. Denies radiation to his jaw or arm. Denies shortness of breath or funny feeling in his heart. No trigger or alleviating factor.  He was at his computer desk most time.  Reports hunching over when he works in the computer due to vision issues.  He has no history of hypertension or heart disease.  He has no history of diabetes.  Never used tobacco. Denies cough, fever or chills. Goes to [facility] on Monday and Friday and runs a mile in 10 minutes, swims for 15 minutes and does some resistance exercise. He denies chest pain, dyspnea or palpitation with those activities.   He also helps with construction works on Wednesdays.  PMH/Problem List: has Short-term memory loss; Melanoma of skin, site unspecified; Loss of weight; Health maintenance examination; Abdominal pain, epigastric; Screening cholesterol level; Urinary tract infection, site not specified; Hematuria, gross; and Atypical chest pain on their problem list.   has a past medical history of Medical history non-contributory and Melanoma in situ of back ([place]) (2015).  FH:  Family History  Problem Relation Age of Onset  . Heart disease Mother   . Diabetes Mother        died age   . Arthritis Mother   . Glaucoma Father   . Heart disease Father   . Arthritis Father   . Stroke Father   . Cancer Sister        Lung CA  . Arthritis Brother     SH Social History   Tobacco Use  . Smoking status: Never Smoker  . Smokeless tobacco: Never Used  Substance Use Topics  . Alcohol use: Yes   Alcohol/week: 3.5 oz    Types: 7 Standard drinks or equivalent per week    Comment: daily  . Drug use: No    Review of Systems Review of systems negative except for pertinent positives and negatives in history of present illness above.     Objective:     Vitals:   12/02/17 1552  BP: 122/82  Pulse: (!) 51  Temp: 98.5 F (36.9 C)  TempSrc: Oral  SpO2: 98%  Weight: 161 lb (73 kg)  Height: 6\' 1"  (1.854 m)   Body mass index is 21.24 kg/m.  Physical Exam  GEN: appears wel, no apparent distress. Oropharynx: mmm without erythema or exudation HEM: negative for cervical or periauricular lymphadenopathies CVS: Bradycardic to 50s, RR, nl s1 & s2, no murmurs, no edema RESP: no IWOB, good air movement bilaterally, CTAB GI: BS present & normal, soft, NTND MSK: no focal tenderness to palpation over his chest. SKIN: no apparent skin lesion NEURO: alert and oiented appropriately, no gross deficits  PSYCH: euthymic mood with congruent affect    Assessment and Plan:  1. Atypical chest pain: Likely musculoskeletal likely due to poor posture when he sits at his computer desk.  Chest pain is not exertional.  He is a very active guy.  Runs a mile in 10 minutes and swims for 15 minutes without chest pain  or dyspnea.  He has no cardiopulmonary symptoms.  No significant risk factors except for age.  Discussed about adjusting his computer desk.  Also recommended going to his eye doctor.  Discussed return precautions in detail. . 2. Bradycardia: Heart rate in 50s.  He says his heart rate has been like that at rest.  He is not symptomatic from this.  He is not on nodal blocking agents.   Return if symptoms worsen or fail to improve.  Mercy Riding, MD 12/02/17 Pager: [phone]

## 2017-12-02 NOTE — Patient Instructions (Signed)
It was great seeing you today! We have addressed the following issues today   Chest pain: this is likely due to your sitting position on the computer.  I have very low suspicion that this is coming from your heart.  I recommend frequent breaks while working on your computer.  I also suggest adjusting your computer desk. Keep up the exercise.  Please seek immediate care if you have severe chest pain, trouble breathing, funny feeling in the chest or other symptoms concerning to you.    If we did any lab work today, and the results require attention, either me or my nurse will get in touch with you. If everything is normal, you will get a letter in mail and a message via . If you don't hear from [place] in two weeks, please give [place] a call. Otherwise, we look forward to seeing you again at your next visit. If you have any questions or concerns before then, please call the clinic at [phone].  Please bring all your medications to every doctors visit  Sign up for My Chart to have easy access to your labs results, and communication with your Primary care physician.    Please check-out at the front desk before leaving the clinic.    Take Care,   Dr. Cyndia Skeeters

## 2018-03-24 ENCOUNTER — Telehealth: Payer: Self-pay | Admitting: Family Medicine

## 2018-04-09 ENCOUNTER — Encounter: Payer: Self-pay | Admitting: Family Medicine

## 2018-04-09 ENCOUNTER — Other Ambulatory Visit: Payer: Self-pay

## 2018-04-09 ENCOUNTER — Ambulatory Visit (INDEPENDENT_AMBULATORY_CARE_PROVIDER_SITE_OTHER): Payer: Medicare Other | Admitting: Family Medicine

## 2018-04-09 DIAGNOSIS — D696 Thrombocytopenia, unspecified: Secondary | ICD-10-CM | POA: Diagnosis not present

## 2018-04-09 DIAGNOSIS — Z86010 Personal history of colonic polyps: Secondary | ICD-10-CM

## 2018-04-09 DIAGNOSIS — N529 Male erectile dysfunction, unspecified: Secondary | ICD-10-CM | POA: Insufficient documentation

## 2018-04-09 DIAGNOSIS — Z13220 Encounter for screening for lipoid disorders: Secondary | ICD-10-CM

## 2018-04-09 DIAGNOSIS — R413 Other amnesia: Secondary | ICD-10-CM

## 2018-04-09 MED ORDER — SILDENAFIL CITRATE 25 MG PO TABS: 25.0000 mg | ORAL_TABLET | Freq: Every day | ORAL | 0 refills | Status: DC | PRN

## 2018-04-09 NOTE — Patient Instructions (Addendum)
I will post the results on my chart.  If explanation is needed, I will send a letter with the explanation included. Call me when you get back to tell me about medication effectiveness. Call [facility] at your convenience for one more colonoscopy just because you seem like you will live for a good while longer.

## 2018-04-10 ENCOUNTER — Encounter: Payer: Self-pay | Admitting: Family Medicine

## 2018-04-10 DIAGNOSIS — Z86010 Personal history of colonic polyps: Secondary | ICD-10-CM | POA: Insufficient documentation

## 2018-04-10 LAB — LIPID PANEL
CHOL/HDL RATIO: 2.3 ratio (ref 0.0–5.0)
CHOLESTEROL TOTAL: 170 mg/dL (ref 100–199)
HDL: 74 mg/dL (ref >39)
LDL CALC: 84 mg/dL (ref 0–99)
TRIGLYCERIDES: 58 mg/dL (ref 0–149)
VLDL Cholesterol Cal: 12 mg/dL (ref 5–40)

## 2018-04-10 LAB — CBC
Hematocrit: 41.1 % (ref 37.5–51.0)
Hemoglobin: 14 g/dL (ref 13.0–17.7)
MCH: 32 pg (ref 26.6–33.0)
MCHC: 34.1 g/dL (ref 31.5–35.7)
MCV: 94 fL (ref 79–97)
Platelets: 111 10*3/uL — ABNORMAL LOW (ref 150–379)
RBC: 4.38 x10E6/uL (ref 4.14–5.80)
RDW: 14.4 % (ref 12.3–15.4)
WBC: 4.6 10*3/uL (ref 3.4–10.8)

## 2018-04-10 NOTE — Progress Notes (Signed)
   Subjective:    Patient ID: Stephen [name], male    DOB: January 16, 1941, 77 y.o.   MRN: [id number]  HPI Healthy 77 yo with two complaints. 1. Found to have low platelet count at [facility].  Has been running low.  On very few meds - none decrease platelets.  No other cell lines previously down.  Does not take aspirin or NSAID.  No bleeding or bruising. 2. Wants Rx for ED.  Trouble maintaining an erection.  Two friends with prostate problems are effectively treated with intracarernous injections.  No previous ED Rx.  No nitrates or known CAD.  Wife accompanies patient and they jointly look forward to treatment. 3. Discussed colonoscopy.  He had previous polyps and his last colonoscopy was 2011.  Discussed that he is very healthy and has >10 year life expectancy.   4. He was seen in [facility] geri assess for mild cognitive decline.  He was begun on low dose citalopram.  Leaving on vacation tomorrow.  Most things will await his return. Review of Systems     Objective:   Physical ExamVS noted Skin no bruising.  Lungs clear Cardiac RRR without m or g        Assessment & Plan:

## 2018-04-10 NOTE — Assessment & Plan Note (Signed)
Recheck. Results show mild thrombocytopenia with other cell lines normal.  Observe for now.  Doubt sig pathology.

## 2018-04-10 NOTE — Assessment & Plan Note (Signed)
Rx citalopram trial per [facility] geri.

## 2018-04-10 NOTE — Assessment & Plan Note (Signed)
Trial of viagra.  Start at lowest dose.

## 2018-04-10 NOTE — Assessment & Plan Note (Signed)
Excellent levels.  No intervention for primary prevention.

## 2018-04-10 NOTE — Assessment & Plan Note (Signed)
Encouraged to have one last colonoscopy.  He will call and make appointment

## 2018-04-17 ENCOUNTER — Encounter: Payer: Self-pay | Admitting: Family Medicine

## 2018-07-22 ENCOUNTER — Other Ambulatory Visit: Payer: Self-pay | Admitting: Family Medicine

## 2018-07-22 MED ORDER — ZOSTER VAC RECOMB ADJUVANTED 50 MCG/0.5ML IM SUSR: 0.5000 mL | Freq: Once | INTRAMUSCULAR | 1 refills | Status: AC

## 2018-08-12 ENCOUNTER — Ambulatory Visit (HOSPITAL_COMMUNITY)
Admission: RE | Admit: 2018-08-12 | Discharge: 2018-08-12 | Disposition: A | Discharge status: Home / Self Care | Payer: Medicare Other | Source: Ambulatory Visit | Attending: Family Medicine | Admitting: Family Medicine

## 2018-08-12 ENCOUNTER — Telehealth: Payer: Self-pay | Admitting: Family Medicine

## 2018-08-12 ENCOUNTER — Ambulatory Visit (INDEPENDENT_AMBULATORY_CARE_PROVIDER_SITE_OTHER): Payer: Medicare Other | Admitting: Family Medicine

## 2018-08-12 ENCOUNTER — Encounter: Payer: Self-pay | Admitting: Family Medicine

## 2018-08-12 VITALS — BP 112/62 | HR 49 | Temp 97.9°F | Wt 162.3 lb

## 2018-08-12 DIAGNOSIS — R55 Syncope and collapse: Secondary | ICD-10-CM | POA: Insufficient documentation

## 2018-08-12 DIAGNOSIS — I491 Atrial premature depolarization: Secondary | ICD-10-CM | POA: Insufficient documentation

## 2018-08-12 DIAGNOSIS — I4510 Unspecified right bundle-branch block: Secondary | ICD-10-CM | POA: Diagnosis not present

## 2018-08-12 DIAGNOSIS — R079 Chest pain, unspecified: Secondary | ICD-10-CM | POA: Diagnosis not present

## 2018-08-12 DIAGNOSIS — I452 Bifascicular block: Secondary | ICD-10-CM | POA: Insufficient documentation

## 2018-08-12 NOTE — Progress Notes (Signed)
   Subjective:    Patient ID: Stephen [name], male    DOB: [birth year], 77 y.o.   MRN: [id number]  HPI Near syncope: Stephen [name] is just returning from an [place] cruise.  During the cruise, his wife became ill with a cough and fever.  5 days ago he developed a small cough without fever.  It took them 36 hours to fly home due to cancelations and missed connections.  He ate and drank during that time but did not get anywhere near a normal amount of sleep.  They returned late yesterday afternoon.  This morning, as he got up to urinate, he felt lightheaded and nearly passed out.   Denies fever. No longer has cough.  Denies chest pain/discomfort, SOB, leg swelling, GI sx, urgency, frequency, dysuria, skin changes. He did have diaphoresis with the near syncope event.  No palpitations and no history of MI or DVT.  Denies focal neuro events.  He thinks he is simply over tired.    Review of Systems     Objective:   Physical Exam VS noted - bradycardia is longstanding Lungs clear Cardiac brady without m or g Abd benign Skin, no changes Neuro, no motor or sensory deficits.  Gait I normal.  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  No old EKG to compare.      Assessment & Plan:

## 2018-08-12 NOTE — Assessment & Plan Note (Addendum)
Likely he is correct.  He is overtired and perhaps a little dry.   Serious considerations. Cardiac: Diaphoresis, no chest pain.  No old EKG to compare, will check troponin x 1 PE: No CP or SOB.  Syncope can be a symptom and he has the risk factor or prolonged travel: Will check stat d dimer Dehydration - Check CMP.  Wt is reassuring Infection: No source - WBC will be another data point. Occult medical illness: CMP and CBC are a good scan.  Confered with patient and wife: they would like an outpatient WU as opposed to admit for obs.

## 2018-08-12 NOTE — Telephone Encounter (Addendum)
**  After Hours/ Emergency Line Call*  Received a call from [facility] to report that Stephen [name] had an elevated d-dimer of 0.89. Troponin was <0.01.   I called patient to discuss this result. I left a voicemail for him to call me back. Based on age-adjustment, VTE likelihood very low. In clinic earlier today he was not tachycardic. Risk factor for VTE is prolonged travel.  Patient's wife called me back to say Stephen [name] is feeling well. No chest pain or shortness of breath. He almost feels back to his normal self. Discussed reasons to go to ED with her. She is agreeable and appreciative of call. All questions answered.  Will forward to PCP.  [name], DO PGY-3, [facility]

## 2018-08-12 NOTE — Patient Instructions (Signed)
No viagra until we have this illness sorted out.   Be careful about standing until we sort things out.   I will call to check on your tomorrow and give you results. There is a small chance I will call you tonight.

## 2018-08-13 LAB — CMP14+EGFR
ALBUMIN: 4.2 g/dL (ref 3.5–4.8)
ALK PHOS: 59 IU/L (ref 39–117)
ALT: 28 IU/L (ref 0–44)
AST: 34 IU/L (ref 0–40)
Albumin/Globulin Ratio: 2 (ref 1.2–2.2)
BILIRUBIN TOTAL: 0.6 mg/dL (ref 0.0–1.2)
BUN / CREAT RATIO: 17 (ref 10–24)
BUN: 21 mg/dL (ref 8–27)
CO2: 25 mmol/L (ref 20–29)
Calcium: 8.8 mg/dL (ref 8.6–10.2)
Chloride: 95 mmol/L — ABNORMAL LOW (ref 96–106)
Creatinine, Ser: 1.23 mg/dL (ref 0.76–1.27)
GFR calc non Af Amer: 57 mL/min/{1.73_m2} — ABNORMAL LOW (ref >59)
GFR, EST AFRICAN AMERICAN: 66 mL/min/{1.73_m2} (ref >59)
GLOBULIN, TOTAL: 2.1 g/dL (ref 1.5–4.5)
Glucose: 116 mg/dL — ABNORMAL HIGH (ref 65–99)
Potassium: 4.8 mmol/L (ref 3.5–5.2)
SODIUM: 135 mmol/L (ref 134–144)
TOTAL PROTEIN: 6.3 g/dL (ref 6.0–8.5)

## 2018-08-13 LAB — CBC
HEMATOCRIT: 42.7 % (ref 37.5–51.0)
HEMOGLOBIN: 14.3 g/dL (ref 13.0–17.7)
MCH: 31.4 pg (ref 26.6–33.0)
MCHC: 33.5 g/dL (ref 31.5–35.7)
MCV: 94 fL (ref 79–97)
Platelets: 76 10*3/uL — CL (ref 150–450)
RBC: 4.55 x10E6/uL (ref 4.14–5.80)
RDW: 12.6 % (ref 12.3–15.4)
WBC: 3.2 10*3/uL — AB (ref 3.4–10.8)

## 2018-08-13 LAB — D-DIMER, QUANTITATIVE (NOT AT ARMC): D-DIMER: 0.89 mg{FEU}/L — AB (ref 0.00–0.49)

## 2018-08-13 LAB — TROPONIN I: Troponin I: <0.01 ng/mL (ref 0.00–0.04)

## 2018-08-13 NOTE — Telephone Encounter (Signed)
I have attempted to call x 2 and LM. He will need. 1. FU with me about low platelets. 2. A cardiology referral for bifasicular block and bradycardia.  I agree the age adjusted D dimer is not high and I will not pursue further assuming symptoms resolve.

## 2018-08-14 ENCOUNTER — Other Ambulatory Visit: Payer: Self-pay | Admitting: Family Medicine

## 2018-08-14 DIAGNOSIS — R001 Bradycardia, unspecified: Secondary | ICD-10-CM

## 2018-08-14 NOTE — Telephone Encounter (Signed)
Finally reached patient.  Feeling better but not fully well. Has appointment with me next week to recheck Thrombocytopenia.  Told no ASA or NSAID  He is aware of my concern that he may need a pacemaker.  Cardiology referral ordered.

## 2018-08-21 ENCOUNTER — Encounter: Payer: Self-pay | Admitting: Family Medicine

## 2018-08-26 ENCOUNTER — Other Ambulatory Visit: Payer: Self-pay

## 2018-08-26 ENCOUNTER — Ambulatory Visit (INDEPENDENT_AMBULATORY_CARE_PROVIDER_SITE_OTHER): Payer: Medicare Other | Admitting: Family Medicine

## 2018-08-26 ENCOUNTER — Encounter: Payer: Self-pay | Admitting: Family Medicine

## 2018-08-26 DIAGNOSIS — D696 Thrombocytopenia, unspecified: Secondary | ICD-10-CM | POA: Diagnosis not present

## 2018-08-26 DIAGNOSIS — N529 Male erectile dysfunction, unspecified: Secondary | ICD-10-CM | POA: Diagnosis not present

## 2018-08-26 DIAGNOSIS — R001 Bradycardia, unspecified: Secondary | ICD-10-CM

## 2018-08-26 DIAGNOSIS — Z23 Encounter for immunization: Secondary | ICD-10-CM | POA: Diagnosis not present

## 2018-08-26 MED ORDER — SILDENAFIL CITRATE 25 MG PO TABS: 25.0000 mg | ORAL_TABLET | Freq: Every day | ORAL | 12 refills | Status: DC | PRN

## 2018-08-26 NOTE — Patient Instructions (Signed)
Good luck with your colonoscopy I put in a referral to cardiology, Dr. Meda Coffee.  Call if you haven't heard from anyone by the middle of next week. Flu shot today. I will call tomorrow with the platelet count result.

## 2018-08-27 ENCOUNTER — Encounter: Payer: Self-pay | Admitting: Family Medicine

## 2018-08-27 LAB — CBC WITH DIFFERENTIAL/PLATELET
BASOS: 1 %
Basophils Absolute: 0.1 10*3/uL (ref 0.0–0.2)
EOS (ABSOLUTE): 0 10*3/uL (ref 0.0–0.4)
EOS: 1 %
HEMATOCRIT: 40.9 % (ref 37.5–51.0)
Hemoglobin: 13.6 g/dL (ref 13.0–17.7)
IMMATURE GRANULOCYTES: 0 %
Immature Grans (Abs): 0 10*3/uL (ref 0.0–0.1)
LYMPHS ABS: 1.2 10*3/uL (ref 0.7–3.1)
Lymphs: 27 %
MCH: 31.2 pg (ref 26.6–33.0)
MCHC: 33.3 g/dL (ref 31.5–35.7)
MCV: 94 fL (ref 79–97)
MONOS ABS: 0.2 10*3/uL (ref 0.1–0.9)
Monocytes: 5 %
NEUTROS ABS: 3.1 10*3/uL (ref 1.4–7.0)
NEUTROS PCT: 66 %
Platelets: 173 10*3/uL (ref 150–450)
RBC: 4.36 x10E6/uL (ref 4.14–5.80)
RDW: 13.6 % (ref 12.3–15.4)
WBC: 4.6 10*3/uL (ref 3.4–10.8)

## 2018-08-27 NOTE — Assessment & Plan Note (Signed)
Reassuring platelet count today.  No intervention.  Will continue to monitor with periodic CBC.  He knows to watch for bruising/bleeding.

## 2018-08-27 NOTE — Assessment & Plan Note (Signed)
OK to go back to viagra use as needed.

## 2018-08-27 NOTE — Assessment & Plan Note (Signed)
Asymptomatic.  Still needs cards referral.

## 2018-08-27 NOTE — Progress Notes (Signed)
   Subjective:    Patient ID: Stephen [name], male    DOB: August 02, 1941, 77 y.o.   MRN: [id number]  HPI FU bradycardia and thrombocytopenia.  See last note. Patient feels fully back to normal.  No weakness or lightheadedness.  Issues: Thrombocytopenia.  No bruising or bleeding.  Not taking ASA or NSAIDs or other blood thinners.  Meds are minimal and none are implicated in thrombocytopenia.  Bradycardia, new bifasicular block.  I am reassured somewhat by the transient/resolved nature of his lightheadedness.  I still believe he needs cards eval to determine if pacer is indicated.  HPDP Will get flu shot today.  Has colonoscopy planned for next week.    Review of Systems     Objective:   Physical Exam VS noted. No bruising. Cardiac RRR without m or g        Assessment & Plan:

## 2018-09-01 ENCOUNTER — Encounter: Payer: Self-pay | Admitting: Family Medicine

## 2018-09-17 NOTE — Progress Notes (Signed)
Cardiology Office Note:    Date:  09/18/2018   ID:  Stephen [name], DOB August 03, 1941, MRN [id number]  PCP:  Zenia Resides, MD  Cardiologist:  No primary care provider on file.   Referring MD: Zenia Resides, MD   Chief Complaint  Patient presents with  . Loss of Consciousness    History of Present Illness:    Stephen [name] is a 77 y.o. male with a hx of near syncope and chronic bradycardia who is referred by Dr. Madison [name] for cardiology consultation and work-up.  Patient is very pleasant and had an episode of near syncope after returning home from an [place].  Travel back to [place] was over a 36-hour timeframe and airports with a lot of delays, very little rest, and irregular dietary and fluid intake.  On the morning of the syncope the patient had had nocturia x2 without difficulty.  Upon arising that morning attempting to walk to his closed, he became weak and slowly dropped to the floor with his wife able to give some support.  He was not injured.  He did not lose consciousness completely.  She felt that he was mildly diaphoretic.  He did not appear pale.  He denies nausea.  He feels that he has had a prior fainting spell but cannot remember any details.  This occurred in mid September.  He is back to full activity and has had no difficulty.  No recurrence of dizziness or near syncope.  He is quite active, goes to the [facility] regularly, and has noted no change in his exertional tolerance.  No neurological complaints of occurred.  He denies chest pain and is not having shortness of breath.  There have been no noticeable palpitations associated with the episode or at any other time before after.  He has known of a slow heart rate for most of his adult life.  He is always attributed this to regular exercise and physical fitness.  There is no significant family history of ischemic cardiovascular disease.  Past Medical History:  Diagnosis Date  . Medical history  non-contributory   . Melanoma in situ of back ([place]) 2015    Past Surgical History:  Procedure Laterality Date  . AXILLARY SENTINEL NODE BIOPSY Right 03/05/2013   Procedure: AXILLARY SENTINEL NODE BIOPSY;  Surgeon: Rolm Bookbinder, MD;  Location: [facility];  Service: General;  Laterality: Right;  . basal /sq cell removal  2014   x-4 times removal  . EXCISION MELANOMA WITH SENTINEL LYMPH NODE BIOPSY Right 03/05/2013   Procedure: WIDE LOCAL EXCISION RIGHT UPPER BACK  MELANOMA WITH RIGHT AXILLARYSENTINEL NODE BIOPSY;  Surgeon: Rolm Bookbinder, MD;  Location: [facility];  Service: General;  Laterality: Right;    Current Medications: Current Meds  Medication Sig  . acetaminophen (TYLENOL) 500 MG tablet Take 500 mg by mouth every 6 (six) hours as needed.  . Calcium Carb-Cholecalciferol (CALCIUM-VITAMIN D) 500-200 MG-UNIT tablet Take 1 tablet by mouth daily.  . citalopram (CELEXA) 10 MG tablet Take 10 mg by mouth daily.  . hydrocortisone valerate cream (WESTCORT) 0.2 % Apply 1 application topically daily as needed (dry skin).   . sildenafil (VIAGRA) 25 MG tablet Take 1 tablet (25 mg total) by mouth daily as needed for erectile dysfunction.     Allergies:   Naproxen   Social History   Socioeconomic History  . Marital status: Married    Spouse name: Stephen [name]  . Number of children: 2  . Years of education:  20+  . Highest education level: Not on file  Occupational History  . Occupation: Ship broker    Comment: [employer]  . Financial resource strain: Not on file  . Food insecurity:    Worry: Not on file    Inability: Not on file  . Transportation needs:    Medical: Not on file    Non-medical: Not on file  Tobacco Use  . Smoking status: Never Smoker  . Smokeless tobacco: Never Used  Substance and Sexual Activity  . Alcohol use: Yes    Alcohol/week: 7.0 standard drinks    Types: 7 Standard drinks or equivalent per week    Comment: daily  . Drug use: No  .  Sexual activity: Yes  Lifestyle  . Physical activity:    Days per week: Not on file    Minutes per session: Not on file  . Stress: Not on file  Relationships  . Social connections:    Talks on phone: Not on file    Gets together: Not on file    Attends religious service: Not on file    Active member of club or organization: Not on file    Attends meetings of clubs or organizations: Not on file    Relationship status: Not on file  Other Topics Concern  . Not on file  Social History Narrative   Health Care POA:    Emergency Contact: spouse, Stephen [name] [phone] being treated for Multiple Myeloma   End of Life Plan:       Diet: Patient has a varied diet of protein, vegetable, starch.  Limited red meat   Exercise: Patient exercises a minimum of 3 times a week for 2 hours.   Seatbelts: Patient reports wearing seatbelt when in vehicle.    Stephen [name] Exposure/Protection: Patient reports wearing sunscreen daily.   Hobbies: Biking, swimming, tai chi, yard work   Retired Psychologist, sport and exercise at [employer]         Current Social History   05/28/2017   Who lives at home: Lives with wife, Stephen [name] in two level home 05/28/2017    Transportation: Has own transportation 05/28/2017   Important Relationships & Pets: Wife, kids, son-in-law, grandkids, Stephen [name] (brother) and friends/ "Stephen [name]" (82 lb black lab/boxer mix 05/28/2017    Current Stressors: Learning to age gracefully, decreased endurance, slower speed of recovery 05/28/2017   Work / Education:  Retired Conservation officer, nature professor/PhD. Currently works for [employer] and ramps for patients 05/28/2017   Religious / Personal Beliefs: "No formal religion" 05/28/2017   Interests / Fun: Bike, read, travel 05/28/2017   Other: Very involved with anti-racial org in [place] and [place] 05/28/2017   L. Ducatte, RN, BSN                                                                                                            Family History: The patient's family  history includes Arthritis in his brother, father, and mother; Cancer in his sister; Diabetes in his mother; Glaucoma in his father; Heart  disease in his father and mother; Stroke in his father.  ROS:   Please see the history of present illness.    Only significant medical problem is melanoma.  No recurrence or new skin lesions.  Continues to be physically active.  No chest discomfort with exercise or swimming.  All other systems reviewed and are negative.  EKGs/Labs/Other Studies Reviewed:    The following studies were reviewed today:  No prior cardiac work-up.  Office notes and evaluation by Dr. Andria Frames was carefully reviewed.  EKG:  EKG is not ordered today.  The ekg tracing performed on the morning of the event by Dr. Andria Frames in September 2019 was personally reviewed and demonstrated bifascicular block (incomplete right bundle and left anterior hemiblock), normal PR interval, PACs, poor R wave progression, and inability to exclude the possibility of anterior infarction.  No prior tracings are available for comparison.  Recent Labs: 08/12/2018: ALT 28; BUN 21; Creatinine, Ser 1.23; Potassium 4.8; Sodium 135 08/26/2018: Hemoglobin 13.6; Platelets 173  Recent Lipid Panel    Component Value Date/Time   CHOL 170 04/09/2018 1446   TRIG 58 04/09/2018 1446   HDL 74 04/09/2018 1446   CHOLHDL 2.3 04/09/2018 1446   CHOLHDL 2.8 09/23/2013 1418   VLDL 16 09/23/2013 1418   LDLCALC 84 04/09/2018 1446    Physical Exam:    VS:  BP 126/60   Pulse 60   Ht 6' 1" (1.854 m)   Wt 160 lb 3.2 oz (72.7 kg)   SpO2 97%   BMI 21.14 kg/m     Wt Readings from Last 3 Encounters:  09/18/18 160 lb 3.2 oz (72.7 kg)  08/26/18 156 lb 9.6 oz (71 kg)  08/12/18 162 lb 4.8 oz (73.6 kg)     GEN: He is slender.  Well nourished, well developed in no acute distress HEENT: Normal NECK: No JVD. LYMPHATICS: No lymphadenopathy CARDIAC: Bradycardia otherwise RRR, no murmur, no gallop, no edema. VASCULAR: 2+  carotid, radial, and posterior tibial bilateral pulses.  No bruits. RESPIRATORY:  Clear to auscultation without rales, wheezing or rhonchi  ABDOMEN: Soft, non-tender, non-distended, No pulsatile mass, MUSCULOSKELETAL: No deformity  SKIN: Warm and dry NEUROLOGIC:  Alert and oriented x 3 PSYCHIATRIC:  Normal affect   ASSESSMENT:    1. Syncope, unspecified syncope type   2. Bradycardia   3. Bifascicular block    PLAN:    In order of problems listed above:  1. Suspect the episode of near syncope was vasovagal in nature.  It occurred immediately upon arising from bed and was associated with previous prolonged travel with poor rest and nutrition/hydration.  Furthermore he had nocturia x2 during the night prior to the morning of the event.  He has probably had a prior fainting episode but cannot remember any details.  He has had no subsequent difficulty.  Plan will be to wear a 48-hour Holter monitor for reasons noted below and to perform a 2D Doppler echocardiogram. 2. Significant bradycardia in the setting of bifascicular block.  The patient affirms that he has had slow heart rates most of his adult life.  48-hour Holter monitor to exclude transient episodes of AV block or sinus node dysfunction with pauses or more severe bradycardia.  The study will also allow [place] to determine if heart rate responsiveness is reasonable. 3. I believe the poor R wave progression on EKG is a pseudoinfarction pattern related to left axis deviation from left anterior hemiblock.  2D echocardiography will help [place] to exclude previously  unidentified regional wall motion abnormality.  I will be surprised if any abnormality is found.  Assuming no surprises on Holter monitoring and echocardiogram, no further work-up will be recommended.  If abnormalities are found work-up will be dependent upon abnormalities identified.  Significant discussion concerning etiology of syncopal episodes relative to the patient's own clinical  condition.  Physical exam is entirely normal.  Also alerted the patient about the appearance of his EKG.  Greater than 50% of the time during this office visit was spent in education, counseling, and coordination of care related to underlying disease process and testing as outlined.    Medication Adjustments/Labs and Tests Ordered: Current medicines are reviewed at length with the patient today.  Concerns regarding medicines are outlined above.  Orders Placed This Encounter  Procedures  . HOLTER MONITOR - 48 HOUR  . ECHOCARDIOGRAM COMPLETE   No orders of the defined types were placed in this encounter.   Patient Instructions  Medication Instructions:  Your physician recommends that you continue on your current medications as directed. Please refer to the Current Medication list given to you today.  If you need a refill on your cardiac medications before your next appointment, please call your pharmacy.   Lab work: None If you have labs (blood work) drawn today and your tests are completely normal, you will receive your results only by: Marland Kitchen MyChart Message (if you have MyChart) OR . A paper copy in the mail If you have any lab test that is abnormal or we need to change your treatment, we will call you to review the results.  Testing/Procedures: Your physician has requested that you have an echocardiogram. Echocardiography is a painless test that uses sound waves to create images of your heart. It provides your doctor with information about the size and shape of your heart and how well your heart's chambers and valves are working. This procedure takes approximately one hour. There are no restrictions for this procedure.  Your physician has recommended that you wear a 48 hour holter monitor. Holter monitors are medical devices that record the heart's electrical activity. Doctors most often use these monitors to diagnose arrhythmias. Arrhythmias are problems with the speed or rhythm of the  heartbeat. The monitor is a small, portable device. You can wear one while you do your normal daily activities. This is usually used to diagnose what is causing palpitations/syncope (passing out).    Follow-Up:  Your physician recommends that you schedule a follow-up appointment as needed with Dr. Tamala Julian.   Any Other Special Instructions Will Be Listed Below (If Applicable).       Signed, Sinclair Grooms, MD  09/18/2018 12:12 PM    [place]

## 2018-09-18 ENCOUNTER — Ambulatory Visit: Payer: Medicare Other | Admitting: Interventional Cardiology

## 2018-09-18 ENCOUNTER — Encounter: Payer: Self-pay | Admitting: Interventional Cardiology

## 2018-09-18 VITALS — BP 126/60 | HR 60 | Ht 73.0 in | Wt 160.2 lb

## 2018-09-18 DIAGNOSIS — R001 Bradycardia, unspecified: Secondary | ICD-10-CM | POA: Diagnosis not present

## 2018-09-18 DIAGNOSIS — R55 Syncope and collapse: Secondary | ICD-10-CM

## 2018-09-18 DIAGNOSIS — I452 Bifascicular block: Secondary | ICD-10-CM

## 2018-09-18 NOTE — Patient Instructions (Addendum)
Medication Instructions:  Your physician recommends that you continue on your current medications as directed. Please refer to the Current Medication list given to you today.  If you need a refill on your cardiac medications before your next appointment, please call your pharmacy.   Lab work: None If you have labs (blood work) drawn today and your tests are completely normal, you will receive your results only by: Marland Kitchen MyChart Message (if you have MyChart) OR . A paper copy in the mail If you have any lab test that is abnormal or we need to change your treatment, we will call you to review the results.  Testing/Procedures: Your physician has requested that you have an echocardiogram. Echocardiography is a painless test that uses sound waves to create images of your heart. It provides your doctor with information about the size and shape of your heart and how well your heart's chambers and valves are working. This procedure takes approximately one hour. There are no restrictions for this procedure.  Your physician has recommended that you wear a 48 hour holter monitor. Holter monitors are medical devices that record the heart's electrical activity. Doctors most often use these monitors to diagnose arrhythmias. Arrhythmias are problems with the speed or rhythm of the heartbeat. The monitor is a small, portable device. You can wear one while you do your normal daily activities. This is usually used to diagnose what is causing palpitations/syncope (passing out).    Follow-Up:  Your physician recommends that you schedule a follow-up appointment as needed with Dr. Tamala Julian.   Any Other Special Instructions Will Be Listed Below (If Applicable).

## 2018-09-23 ENCOUNTER — Other Ambulatory Visit: Payer: Self-pay

## 2018-09-23 ENCOUNTER — Ambulatory Visit (HOSPITAL_COMMUNITY): Payer: Medicare Other | Attending: Cardiovascular Disease

## 2018-09-23 ENCOUNTER — Ambulatory Visit (INDEPENDENT_AMBULATORY_CARE_PROVIDER_SITE_OTHER): Payer: Medicare Other

## 2018-09-23 DIAGNOSIS — R55 Syncope and collapse: Secondary | ICD-10-CM

## 2018-12-05 ENCOUNTER — Encounter: Payer: Self-pay | Admitting: Family Medicine

## 2018-12-18 ENCOUNTER — Telehealth: Payer: Self-pay

## 2018-12-18 NOTE — Telephone Encounter (Signed)
Done

## 2018-12-18 NOTE — Telephone Encounter (Signed)
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  Please mail to home.  Call back for questions: [phone]   Danley Danker, RN [facility] [place])

## 2018-12-18 NOTE — Telephone Encounter (Signed)
Pt informed letter has been put in outgoing mail. Ottis Stain, CMA

## 2019-10-20 ENCOUNTER — Ambulatory Visit (INDEPENDENT_AMBULATORY_CARE_PROVIDER_SITE_OTHER): Payer: Medicare Other

## 2019-10-20 ENCOUNTER — Other Ambulatory Visit: Payer: Self-pay

## 2019-10-20 VITALS — Ht 73.0 in | Wt 154.0 lb

## 2019-10-20 DIAGNOSIS — Z0000 Encounter for general adult medical examination without abnormal findings: Secondary | ICD-10-CM | POA: Diagnosis not present

## 2019-10-20 NOTE — Progress Notes (Addendum)
Subjective:   Stephen [name] is a 78 y.o. male who presents for Medicare Annual/Subsequent preventive examination.  The patient consented to a virtual visit.   Review of Systems: Defer to PCP.  Cardiac Risk Factors include: advanced age (>59mn, >>72women)  Objective:    Vitals: Ht 6' 1"  (1.854 m)   Wt 154 lb (69.9 kg)   BMI 20.32 kg/m   Body mass index is 20.32 kg/m.  Advanced Directives 10/21/2019 08/26/2018 04/09/2018 12/02/2017 05/28/2017 09/15/2015 03/03/2013  Does Patient Have a Medical Advance Directive? Yes No No Yes Yes Yes Patient has advance directive, copy not in chart  Type of Advance Directive Living will;Healthcare Power of [place] Living will;Healthcare Power of Attorney -  Does patient want to make changes to medical advance directive? No - Patient declined - - - - - -  Copy of [place] Chart? Yes - validated most recent copy scanned in chart (See row information) - - Yes No - copy requested Yes -  Would patient like information on creating a medical advance directive? - No - Patient declined No - Patient declined No - Patient declined - - -   Tobacco Social History   Tobacco Use  Smoking Status Never Smoker  Smokeless Tobacco Never Used     Clinical Intake:  Pre-visit preparation completed: Yes  How often do you need to have someone help you when you read instructions, pamphlets, or other written materials from your doctor or pharmacy?: 1 - Never What is the last grade level you completed in school?: PhD in Economics  Interpreter Needed?: No  Past Medical History:  Diagnosis Date  . Medical history non-contributory   . [place]   Past Surgical History:  Procedure Laterality Date  . AXILLARY SENTINEL NODE BIOPSY Right 03/05/2013   Procedure: AXILLARY SENTINEL NODE BIOPSY;  Surgeon: MRolm Bookbinder MD;  [facility]  Service:  General;  Laterality: Right;  . basal /sq cell removal  2014   x-4 times removal  . EXCISION MELANOMA WITH SENTINEL LYMPH NODE BIOPSY Right 03/05/2013   Procedure: WIDE LOCAL EXCISION RIGHT UPPER BACK  MELANOMA WITH RIGHT AXILLARYSENTINEL NODE BIOPSY;  Surgeon: MRolm Bookbinder MD;  [facility] [facility]  Service: General;  Laterality: Right;   Family History  Problem Relation Age of Onset  . Heart disease Mother   . Diabetes Mother        died age   . Arthritis Mother   . Glaucoma Father   . Heart disease Father   . Arthritis Father   . Stroke Father   . Cancer Sister        Lung CA  . Arthritis Brother    Social History   Socioeconomic History  . Marital status: Married    Spouse name: CLyndee [name] . Number of children: 2  . Years of education: 279+ . Highest education level: Not on file  Occupational History  . Occupation: RShip broker   Comment: [employer] . Financial resource strain: Not hard at all  . Food insecurity    Worry: Never true    Inability: Never true  . Transportation needs    Medical: No    Non-medical: No  Tobacco Use  . Smoking status: Never Smoker  . Smokeless tobacco: Never Used  Substance and Sexual Activity  . Alcohol use: Yes  Alcohol/week: 7.0 standard drinks    Types: 7 Standard drinks or equivalent per week    Comment: daily  . Drug use: No  . Sexual activity: Yes  Lifestyle  . Physical activity    Days per week: 5 days    Minutes per session: 60 min  . Stress: To some extent  Relationships  . Social connections    Talks on phone: More than three times a week    Gets together: More than three times a week    Attends religious service: Never    Active member of club or organization: Yes    Attends meetings of clubs or organizations: More than 4 times per year    Relationship status: Married  Other Topics Concern  . Not on file  Social History Narrative   Health Care POA:    Emergency Contact:  spouse, Stephen [name] [phone] being treated for Multiple Myeloma   End of Life Plan:        Diet: Patient has a varied diet of protein, vegetable, starch.  Limited red meat   Exercise: Patient exercises a minimum of 3 times a week for 2 hours.   Seatbelts: Patient reports wearing seatbelt when in vehicle.    Stephen [name] Exposure/Protection: Patient reports wearing sunscreen daily.   Hobbies: Biking, swimming, tai chi, yard work   Retired Psychologist, sport and exercise at [employer]         Current Social History   05/28/2017   Who lives at home: Lives with wife, Stephen [name] in two level home 05/28/2017    Transportation: Has own transportation 05/28/2017   Important Relationships & Pets: Wife, kids, son-in-law, grandkids, Stephen [name] (brother) and friends/ "Stephen [name]" (54 lb black lab/boxer mix 05/28/2017    Current Stressors: Learning to age gracefully, decreased endurance, slower speed of recovery 05/28/2017   Work / Education:  Retired Conservation officer, nature professor/PhD. Currently works for [employer] and ramps for patients 05/28/2017   Religious / Personal Beliefs: "No formal religion" 05/28/2017   Interests / Fun: Bike, read, travel 05/28/2017   Other: Very involved with anti-racial org in [place] and [place] 05/28/2017   L. Ducatte, RN, BSN                                                                                                          Outpatient Encounter Medications as of 10/20/2019  Medication Sig  . acetaminophen (TYLENOL) 500 MG tablet Take 500 mg by mouth every 6 (six) hours as needed.  . Calcium Carb-Cholecalciferol (CALCIUM-VITAMIN D) 500-200 MG-UNIT tablet Take 1 tablet by mouth daily.  . citalopram (CELEXA) 10 MG tablet Take 10 mg by mouth daily.  . sildenafil (VIAGRA) 25 MG tablet Take 1 tablet (25 mg total) by mouth daily as needed for erectile dysfunction.  . hydrocortisone valerate cream (WESTCORT) 0.2 % Apply 1 application topically daily as needed (dry skin).    No facility-administered encounter  medications on file as of 10/20/2019.    Activities of Daily Living In your present state of health, do you have any difficulty performing the  following activities: 10/21/2019 10/20/2019  Hearing? - -  Vision? - -  Difficulty concentrating or making decisions? Stephen [name]  Comment seeing a specialist in [place] -  Walking or climbing stairs? - -  Dressing or bathing? - -  Doing errands, shopping? - Facilities manager and eating ? - -  Using the Toilet? - -  In the past six months, have you accidently leaked urine? - -  Do you have problems with loss of bowel control? - -  Managing your Medications? - -  Managing your Finances? - -  Housekeeping or managing your Housekeeping? - -  Some recent data might be hidden   Patient Care Team: Zenia Resides, MD as PCP - General (Family Medicine) Barbaraann Cao, [place] as Referring Physician (Optometry) Particia Nearing, MD (Dermatology) Motting[place], Sharyn Lull (Dentistry) Clarene Essex, MD as Attending Physician (Gastroenterology)   Assessment:   This is a routine wellness examination for [employer].  Exercise Activities and Dietary recommendations Current Exercise Habits: Home exercise routine, Type of exercise: walking;Other - see comments(biking), Time (Minutes): 60, Frequency (Times/Week): 5, Weekly Exercise (Minutes/Week): 300  Goals    . Maintaining current level of physical activity and weight (pt-stated)      Fall Risk Fall Risk  10/21/2019 10/20/2019 08/26/2018 04/09/2018 12/02/2017  Falls in the past year? 0 0 No No No  Comment - - - - -  Number falls in past yr: - - - - -  Injury with Fall? - - - - -  Comment - - - - -  Follow up - - - - -   Is the patient's home free of loose throw rugs in walkways, pet beds, electrical cords, etc?   yes      Grab bars in the bathroom? yes      Handrails on the stairs?   yes      Adequate lighting?   yes  Patient rating of health (0-10): 9   Depression Screen PHQ 2/9 Scores 10/20/2019  08/26/2018 04/09/2018 12/02/2017  PHQ - 2 Score 0 0 0 0   Cognitive Function MMSE - Mini Mental State Exam 03/06/2012 05/09/2011  Orientation to time 5 5  Orientation to Place 5 5  Registration 3 3  Attention/ Calculation 5 5  Recall 3 3  Language- name 2 objects 2 2  Language- repeat 1 1  Language- follow 3 step command 3 3  Language- read & follow direction 1 1  Write a sentence 1 1  Copy design 1 1  Total score 30 30   6CIT Screen 10/20/2019  What Year? 0 points  What month? 0 points  What time? 0 points  Count back from 20 0 points  Months in reverse 0 points  Repeat phrase 0 points  Total Score 0   Immunization History  Administered Date(s) Administered  . Fluad Quad(high Dose 65+) 07/22/2019  . Influenza Whole 10/14/2009  . Influenza, High Dose Seasonal PF 08/27/2016  . Influenza,inj,Quad PF,6+ Mos 09/23/2013, 08/26/2018  . Influenza-Unspecified 09/09/2015, 08/26/2017  . Pneumococcal Conjugate-13 09/15/2015  . Pneumococcal Polysaccharide-23 10/14/2009  . Td 10/14/2009  . Zoster Recombinat (Shingrix) 07/22/2019   Screening Tests Health Maintenance  Topic Date Due  . TETANUS/TDAP  10/15/2019  . COLONOSCOPY  09/04/2023  . INFLUENZA VACCINE  Completed  . PNA vac Low Risk Adult  Completed   Cancer Screenings: Lung: Low Dose CT Chest recommended if Age 25-80 years, 30 pack-year currently smoking OR have quit w/in 15years. Patient  does not qualify. Colorectal: Yes, next due 08/2023  Additional Screenings: PNA Vaccine: Completed   Plan:  Keep up the good work! Continue to stay positive during the Pandemic!   I have personally reviewed and noted the following in the patient's chart:   . Medical and social history . Use of alcohol, tobacco or illicit drugs  . Current medications and supplements . Functional ability and status . Nutritional status . Physical activity . Advanced directives . List of other physicians . Hospitalizations, surgeries, and ER visits  in previous 12 months . Vitals . Screenings to include cognitive, depression, and falls . Referrals and appointments  In addition, I have reviewed and discussed with patient certain preventive protocols, quality metrics, and best practice recommendations. A written personalized care plan for preventive services as well as general preventive health recommendations were provided to patient.  This visit was conducted virtually in the setting of the [place] pandemic.    Dorna Bloom, CMA  10/21/2019    I have reviewed this visit and agree with the documentation.

## 2019-10-21 NOTE — Patient Instructions (Addendum)
You spoke to Stephen [name], [place] over the phone for your annual wellness visit.  We discussed goals: Goals    . Maintaining current level of physical activity and weight (pt-stated)      We also discussed recommended health maintenance.As discussed, you are up to date with pretty much everything! You can get your Tdap, however a prescription will need to go to your pharmacy.   Health Maintenance  Topic Date Due  . TETANUS/TDAP  10/15/2019  . COLONOSCOPY  09/04/2023  . INFLUENZA VACCINE  Completed  . PNA vac Low Risk Adult  Completed   Keep up the good work! Continue to stay positive during the Pandemic!   Preventive Care 77 Years and Older, Male Preventive care refers to lifestyle choices and visits with your health care provider that can promote health and wellness. This includes:  A yearly physical exam. This is also called an annual well check.  Regular dental and eye exams.  Immunizations.  Screening for certain conditions.  Healthy lifestyle choices, such as diet and exercise. What can I expect for my preventive care visit? Physical exam Your health care provider will check:  Height and weight. These may be used to calculate body mass index (BMI), which is a measurement that tells if you are at a healthy weight.  Heart rate and blood pressure.  Your skin for abnormal spots. Counseling Your health care provider may ask you questions about:  Alcohol, tobacco, and drug use.  Emotional well-being.  Home and relationship well-being.  Sexual activity.  Eating habits.  History of falls.  Memory and ability to understand (cognition).  Work and work Statistician. What immunizations do I need?  Influenza (flu) vaccine  This is recommended every year. Tetanus, diphtheria, and pertussis (Tdap) vaccine  You may need a Td booster every 10 years. Varicella (chickenpox) vaccine  You may need this vaccine if you have not already been vaccinated. Zoster  (shingles) vaccine  You may need this after age 77. Pneumococcal conjugate (PCV13) vaccine  One dose is recommended after age 77. Pneumococcal polysaccharide (PPSV23) vaccine  One dose is recommended after age 77. Measles, mumps, and rubella (MMR) vaccine  You may need at least one dose of MMR if you were born in 1957 or later. You may also need a second dose. Meningococcal conjugate (MenACWY) vaccine  You may need this if you have certain conditions. Hepatitis A vaccine  You may need this if you have certain conditions or if you travel or work in places where you may be exposed to hepatitis A. Hepatitis B vaccine  You may need this if you have certain conditions or if you travel or work in places where you may be exposed to hepatitis B. Haemophilus influenzae type b (Hib) vaccine  You may need this if you have certain conditions. You may receive vaccines as individual doses or as more than one vaccine together in one shot (combination vaccines). Talk with your health care provider about the risks and benefits of combination vaccines. What tests do I need? Blood tests  Lipid and cholesterol levels. These may be checked every 5 years, or more frequently depending on your overall health.  Hepatitis C test.  Hepatitis B test. Screening  Lung cancer screening. You may have this screening every year starting at age 10 if you have a 30-pack-year history of smoking and currently smoke or have quit within the past 15 years.  Colorectal cancer screening. All adults should have this screening starting at  age 77 and continuing until age 77. Your health care provider may recommend screening at age 24 if you are at increased risk. You will have tests every 1-10 years, depending on your results and the type of screening test.  Prostate cancer screening. Recommendations will vary depending on your family history and other risks.  Diabetes screening. This is done by checking your blood sugar  (glucose) after you have not eaten for a while (fasting). You may have this done every 1-3 years.  Abdominal aortic aneurysm (AAA) screening. You may need this if you are a current or former smoker.  Sexually transmitted disease (STD) testing. Follow these instructions at home: Eating and drinking  Eat a diet that includes fresh fruits and vegetables, whole grains, lean protein, and low-fat dairy products. Limit your intake of foods with high amounts of sugar, saturated fats, and salt.  Take vitamin and mineral supplements as recommended by your health care provider.  Do not drink alcohol if your health care provider tells you not to drink.  If you drink alcohol: ? Limit how much you have to 0-2 drinks a day. ? Be aware of how much alcohol is in your drink. In the [place] one drink equals one 12 oz bottle of beer (355 mL), one 5 oz glass of wine (148 mL), or one 1 oz glass of hard liquor (44 mL). Lifestyle  Take daily care of your teeth and gums.  Stay active. Exercise for at least 30 minutes on 5 or more days each week.  Do not use any products that contain nicotine or tobacco, such as cigarettes, e-cigarettes, and chewing tobacco. If you need help quitting, ask your health care provider.  If you are sexually active, practice safe sex. Use a condom or other form of protection to prevent STIs (sexually transmitted infections).  Talk with your health care provider about taking a low-dose aspirin or statin. What's next?  Visit your health care provider once a year for a well check visit.  Ask your health care provider how often you should have your eyes and teeth checked.  Stay up to date on all vaccines. This information is not intended to replace advice given to you by your health care provider. Make sure you discuss any questions you have with your health care provider. Document Released: 12/09/2015 Document Revised: 11/06/2018 Document Reviewed: 11/06/2018 Elsevier Patient  Education  2020 [facility]'s number is [phone]. Please call with questions or concerns about what we discussed today.

## 2019-12-18 ENCOUNTER — Ambulatory Visit: Payer: Medicare PPO | Attending: Internal Medicine

## 2019-12-18 DIAGNOSIS — Z23 Encounter for immunization: Secondary | ICD-10-CM | POA: Insufficient documentation

## 2019-12-18 NOTE — Progress Notes (Signed)
   Covid-19 Vaccination Clinic  Name:  Stephen [name]    MRN: [id number] DOB: [birth year]  12/18/2019  Mr. Stephen [name] was observed post Covid-19 immunization for 15 minutes without incidence. He was provided with Vaccine Information Sheet and instruction to access the V-Safe system.   Mr. Stephen [name] was instructed to call 911 with any severe reactions post vaccine: Marland Kitchen Difficulty breathing  . Swelling of your face and throat  . A fast heartbeat  . A bad rash all over your body  . Dizziness and weakness    Immunizations Administered    Name Date Dose VIS Date Route   Pfizer COVID-19 Vaccine 12/18/2019  9:02 AM 0.3 mL 11/06/2019 Intramuscular   Manufacturer: [place]   Lot: [id number]   [place]: [id number]

## 2020-01-08 ENCOUNTER — Ambulatory Visit: Payer: Medicare PPO | Attending: Internal Medicine

## 2020-01-08 DIAGNOSIS — Z23 Encounter for immunization: Secondary | ICD-10-CM | POA: Insufficient documentation

## 2020-01-08 NOTE — Progress Notes (Signed)
   Covid-19 Vaccination Clinic  Name:  Stephen [name]    MRN: [id number] DOB: 12/05/1940  01/08/2020  Mr. Hellmich was observed post Covid-19 immunization for 15 minutes without incidence. He was provided with Vaccine Information Sheet and instruction to access the V-Safe system.   Mr. Espinel was instructed to call 911 with any severe reactions post vaccine: Marland Kitchen Difficulty breathing  . Swelling of your face and throat  . A fast heartbeat  . A bad rash all over your body  . Dizziness and weakness    Immunizations Administered    Name Date Dose VIS Date Route   Pfizer COVID-19 Vaccine 01/08/2020  8:43 AM 0.3 mL 11/06/2019 Intramuscular   Manufacturer: [place]   Lot: [id number]   [place]: [id number]

## 2020-03-24 DIAGNOSIS — R269 Unspecified abnormalities of gait and mobility: Secondary | ICD-10-CM | POA: Diagnosis not present

## 2020-03-24 DIAGNOSIS — M79605 Pain in left leg: Secondary | ICD-10-CM | POA: Diagnosis not present

## 2020-03-29 ENCOUNTER — Other Ambulatory Visit: Payer: Self-pay | Admitting: Family Medicine

## 2020-03-29 DIAGNOSIS — N529 Male erectile dysfunction, unspecified: Secondary | ICD-10-CM

## 2020-03-31 DIAGNOSIS — M79605 Pain in left leg: Secondary | ICD-10-CM | POA: Diagnosis not present

## 2020-03-31 DIAGNOSIS — R269 Unspecified abnormalities of gait and mobility: Secondary | ICD-10-CM | POA: Diagnosis not present

## 2020-04-11 DIAGNOSIS — M79605 Pain in left leg: Secondary | ICD-10-CM | POA: Diagnosis not present

## 2020-04-11 DIAGNOSIS — R269 Unspecified abnormalities of gait and mobility: Secondary | ICD-10-CM | POA: Diagnosis not present

## 2020-04-26 DIAGNOSIS — M79605 Pain in left leg: Secondary | ICD-10-CM | POA: Diagnosis not present

## 2020-04-26 DIAGNOSIS — R269 Unspecified abnormalities of gait and mobility: Secondary | ICD-10-CM | POA: Diagnosis not present

## 2020-04-28 DIAGNOSIS — F339 Major depressive disorder, recurrent, unspecified: Secondary | ICD-10-CM | POA: Diagnosis not present

## 2020-04-28 DIAGNOSIS — E538 Deficiency of other specified B group vitamins: Secondary | ICD-10-CM | POA: Insufficient documentation

## 2020-04-28 DIAGNOSIS — G3184 Mild cognitive impairment, so stated: Secondary | ICD-10-CM | POA: Diagnosis not present

## 2020-05-18 DIAGNOSIS — H35373 Puckering of macula, bilateral: Secondary | ICD-10-CM | POA: Diagnosis not present

## 2020-05-18 DIAGNOSIS — H524 Presbyopia: Secondary | ICD-10-CM | POA: Diagnosis not present

## 2020-05-18 DIAGNOSIS — H5203 Hypermetropia, bilateral: Secondary | ICD-10-CM | POA: Diagnosis not present

## 2020-05-18 DIAGNOSIS — H52223 Regular astigmatism, bilateral: Secondary | ICD-10-CM | POA: Diagnosis not present

## 2020-05-19 DIAGNOSIS — R269 Unspecified abnormalities of gait and mobility: Secondary | ICD-10-CM | POA: Diagnosis not present

## 2020-05-19 DIAGNOSIS — M79605 Pain in left leg: Secondary | ICD-10-CM | POA: Diagnosis not present

## 2020-06-15 NOTE — Telephone Encounter (Signed)
Error. Stephen [name], CMA  

## 2020-09-19 ENCOUNTER — Ambulatory Visit: Payer: Medicare PPO | Attending: Internal Medicine

## 2020-09-19 DIAGNOSIS — Z23 Encounter for immunization: Secondary | ICD-10-CM

## 2020-09-19 NOTE — Progress Notes (Signed)
   Covid-19 Vaccination Clinic  Name:  Stephen [name]    MRN: [id number] DOB: July 09, 1941  09/19/2020  [name] was observed post Covid-19 immunization for 15 minutes without incident. He was provided with Vaccine Information Sheet and instruction to access the V-Safe system.   [name] was instructed to call 911 with any severe reactions post vaccine: Marland Kitchen Difficulty breathing  . Swelling of face and throat  . A fast heartbeat  . A bad rash all over body  . Dizziness and weakness

## 2020-09-24 ENCOUNTER — Ambulatory Visit: Payer: Medicare PPO

## 2020-11-07 DIAGNOSIS — D225 Melanocytic nevi of trunk: Secondary | ICD-10-CM | POA: Diagnosis not present

## 2020-11-07 DIAGNOSIS — L814 Other melanin hyperpigmentation: Secondary | ICD-10-CM | POA: Diagnosis not present

## 2020-11-07 DIAGNOSIS — D2271 Melanocytic nevi of right lower limb, including hip: Secondary | ICD-10-CM | POA: Diagnosis not present

## 2020-11-07 DIAGNOSIS — L570 Actinic keratosis: Secondary | ICD-10-CM | POA: Diagnosis not present

## 2020-11-07 DIAGNOSIS — Z85828 Personal history of other malignant neoplasm of skin: Secondary | ICD-10-CM | POA: Diagnosis not present

## 2020-11-07 DIAGNOSIS — L821 Other seborrheic keratosis: Secondary | ICD-10-CM | POA: Diagnosis not present

## 2020-11-07 DIAGNOSIS — Z85820 Personal history of malignant melanoma of skin: Secondary | ICD-10-CM | POA: Diagnosis not present

## 2020-11-07 DIAGNOSIS — D1801 Hemangioma of skin and subcutaneous tissue: Secondary | ICD-10-CM | POA: Diagnosis not present

## 2021-05-11 ENCOUNTER — Encounter: Payer: Self-pay | Admitting: Family Medicine

## 2021-05-11 ENCOUNTER — Ambulatory Visit (INDEPENDENT_AMBULATORY_CARE_PROVIDER_SITE_OTHER): Payer: Medicare PPO

## 2021-05-11 ENCOUNTER — Ambulatory Visit (INDEPENDENT_AMBULATORY_CARE_PROVIDER_SITE_OTHER): Payer: Medicare PPO | Admitting: Family Medicine

## 2021-05-11 ENCOUNTER — Other Ambulatory Visit: Payer: Self-pay

## 2021-05-11 DIAGNOSIS — Z1159 Encounter for screening for other viral diseases: Secondary | ICD-10-CM | POA: Insufficient documentation

## 2021-05-11 DIAGNOSIS — R531 Weakness: Secondary | ICD-10-CM | POA: Insufficient documentation

## 2021-05-11 DIAGNOSIS — S90811A Abrasion, right foot, initial encounter: Secondary | ICD-10-CM | POA: Insufficient documentation

## 2021-05-11 DIAGNOSIS — Z23 Encounter for immunization: Secondary | ICD-10-CM | POA: Diagnosis not present

## 2021-05-11 DIAGNOSIS — D696 Thrombocytopenia, unspecified: Secondary | ICD-10-CM | POA: Diagnosis not present

## 2021-05-11 DIAGNOSIS — Z0000 Encounter for general adult medical examination without abnormal findings: Secondary | ICD-10-CM | POA: Diagnosis not present

## 2021-05-11 NOTE — Assessment & Plan Note (Signed)
Test one time since I am drawing blood.

## 2021-05-11 NOTE — Assessment & Plan Note (Signed)
Healthy male with no at risk behaviors.  Dealing well with death of his wife.

## 2021-05-11 NOTE — Patient Instructions (Signed)
You look well, which is not a surprise to me.  You are returning to your normal healthy state. I will call with blood test results.   Tetanus shot today.   Remember to stay hydrated because of your naturally low blood pressure.

## 2021-05-11 NOTE — Assessment & Plan Note (Signed)
Local care.  Update tetanus.

## 2021-05-11 NOTE — Progress Notes (Signed)
    SUBJECTIVE:   CHIEF COMPLAINT / HPI:   Annual physical: Acute problems: He has a minor abrasion to his right foot.  He is due for a tetanus booster.   Chronic issues: Grief: Wife recently died after a long battle with multiple myeloma.  They were quite a couple.  He feels her loss deeply.  He is taking all the right steps.  He joined a support group through [employer], which he found quite helpful.  He has adult children who are supporting him.  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  He is physically active.  He remains engaged in the coummunity and has not let his world get small.  He continues to take celexa.  Lives alone except for his dog, who is a great companion. Memory issues remain minor.  No evidence of dementia.   Hx of low platelets.  No bleedeing.  Due for check.   HPDP At 46 he has aged out of much of the screening.  He due for a tetanus shot and hep c screening.  Due for lipids. Due for shingrix.   [facility]  Still rides his bike daily.   PERTINENT  PMH / PSH: No CP, SOB, Abd pain, bleeding or worrisome skin lesions.  No change in bowel, bladder, appetite or weight.Low BP noted.  Denies lightheadedness or syncope.  OBJECTIVE:   BP (!) 98/58   Pulse (!) 50   Ht _0  (1.854 m)   Wt 154 lb (69.9 kg)   SpO2 97%   BMI 20.32 kg/m   HEENT WNL Neck supple without masses Lungs clear Cardiac RRR without m or g Abd benign Ext no edema.  Minor abrasion to dorsum of right foot.  Not infected and healing well. Neuro, Motor, sensory, gait, affect and cognition all grossly normal  ASSESSMENT/PLAN:   Abrasion of right foot Local care.  Update tetanus.  Encounter for hepatitis C screening test for low risk patient Test one time since I am drawing blood.  Health maintenance examination Healthy male with no at risk behaviors.  Dealing well with death of his wife.     Zenia Resides, MD [place]

## 2021-05-12 LAB — CBC
Hematocrit: 43.6 % (ref 37.5–51.0)
Hemoglobin: 14.7 g/dL (ref 13.0–17.7)
MCH: 31.9 pg (ref 26.6–33.0)
MCHC: 33.7 g/dL (ref 31.5–35.7)
MCV: 95 fL (ref 79–97)
Platelets: 111 10*3/uL — ABNORMAL LOW (ref 150–450)
RBC: 4.61 x10E6/uL (ref 4.14–5.80)
RDW: 12.6 % (ref 11.6–15.4)
WBC: 4.1 10*3/uL (ref 3.4–10.8)

## 2021-05-12 LAB — CMP14+EGFR
ALT: 14 IU/L (ref 0–44)
AST: 21 IU/L (ref 0–40)
Albumin/Globulin Ratio: 2.4 — ABNORMAL HIGH (ref 1.2–2.2)
Albumin: 4.6 g/dL (ref 3.7–4.7)
Alkaline Phosphatase: 59 IU/L (ref 44–121)
BUN/Creatinine Ratio: 23 (ref 10–24)
BUN: 25 mg/dL (ref 8–27)
Bilirubin Total: 0.8 mg/dL (ref 0.0–1.2)
CO2: 24 mmol/L (ref 20–29)
Calcium: 9.3 mg/dL (ref 8.6–10.2)
Chloride: 104 mmol/L (ref 96–106)
Creatinine, Ser: 1.11 mg/dL (ref 0.76–1.27)
Globulin, Total: 1.9 g/dL (ref 1.5–4.5)
Glucose: 90 mg/dL (ref 65–99)
Potassium: 4.7 mmol/L (ref 3.5–5.2)
Sodium: 142 mmol/L (ref 134–144)
Total Protein: 6.5 g/dL (ref 6.0–8.5)
eGFR: 68 mL/min/{1.73_m2} (ref >59)

## 2021-05-12 LAB — HEPATITIS C ANTIBODY: Hep C Virus Ab: <0.1 s/co ratio (ref 0.0–0.9)

## 2021-05-12 LAB — TSH: TSH: 1.29 u[IU]/mL (ref 0.450–4.500)

## 2021-05-13 LAB — LIPID PANEL
Chol/HDL Ratio: 2.7 ratio (ref 0.0–5.0)
Cholesterol, Total: 174 mg/dL (ref 100–199)
HDL: 64 mg/dL (ref >39)
LDL Chol Calc (NIH): 97 mg/dL (ref 0–99)
Triglycerides: 67 mg/dL (ref 0–149)
VLDL Cholesterol Cal: 13 mg/dL (ref 5–40)

## 2021-05-13 LAB — SPECIMEN STATUS REPORT

## 2021-06-02 ENCOUNTER — Telehealth: Payer: Self-pay | Admitting: *Deleted

## 2021-06-02 DIAGNOSIS — S86912A Strain of unspecified muscle(s) and tendon(s) at lower leg level, left leg, initial encounter: Secondary | ICD-10-CM

## 2021-06-02 NOTE — Telephone Encounter (Signed)
Patient is requesting a referral to see physical therapy at [facility] (phone: [phone]).  He is has seen them in the past and would like to go back for leg tightness in upper part of left leg.  He is also feeling some knee strain on the left side.  Will forward to MD, referral pended in this encounter.  Mycal Conde,CMA

## 2021-06-05 DIAGNOSIS — R29898 Other symptoms and signs involving the musculoskeletal system: Secondary | ICD-10-CM | POA: Insufficient documentation

## 2021-06-05 DIAGNOSIS — S86912A Strain of unspecified muscle(s) and tendon(s) at lower leg level, left leg, initial encounter: Secondary | ICD-10-CM | POA: Insufficient documentation

## 2021-06-05 NOTE — Telephone Encounter (Signed)
Ordered as requested.

## 2021-06-05 NOTE — Assessment & Plan Note (Signed)
Self reported.

## 2021-06-08 ENCOUNTER — Other Ambulatory Visit: Payer: Self-pay

## 2021-06-08 MED ORDER — SERTRALINE HCL 25 MG PO TABS: 25.0000 mg | ORAL_TABLET | Freq: Every day | ORAL | 0 refills | Status: DC

## 2021-06-08 NOTE — Telephone Encounter (Signed)
Patient calls nurse line requesting PCP fill Sertraline HCL 25mg . Patient reports he was originally prescribed this by geriatrics in [facility], however they are refusing to fill until he comes in. Patient has been without for 2 weeks now. Please advise.

## 2021-06-08 NOTE — Telephone Encounter (Signed)
I spoke with patient and he is not taking Celexa. Patient reports he was taken off of this a while ago and sertraline was started.

## 2021-06-08 NOTE — Addendum Note (Signed)
Addended by: Zenia Resides on: 06/08/2021 02:20 PM   Modules accepted: Orders

## 2021-06-15 DIAGNOSIS — S86912D Strain of unspecified muscle(s) and tendon(s) at lower leg level, left leg, subsequent encounter: Secondary | ICD-10-CM | POA: Diagnosis not present

## 2021-06-15 DIAGNOSIS — R269 Unspecified abnormalities of gait and mobility: Secondary | ICD-10-CM | POA: Diagnosis not present

## 2021-06-15 DIAGNOSIS — M79605 Pain in left leg: Secondary | ICD-10-CM | POA: Diagnosis not present

## 2021-09-02 ENCOUNTER — Other Ambulatory Visit: Payer: Self-pay | Admitting: Family Medicine

## 2021-11-02 ENCOUNTER — Encounter: Payer: Self-pay | Admitting: Family Medicine

## 2021-11-02 ENCOUNTER — Ambulatory Visit: Payer: Medicare PPO | Admitting: Family Medicine

## 2021-11-02 ENCOUNTER — Other Ambulatory Visit: Payer: Self-pay

## 2021-11-02 DIAGNOSIS — G3184 Mild cognitive impairment, so stated: Secondary | ICD-10-CM

## 2021-11-02 DIAGNOSIS — Z113 Encounter for screening for infections with a predominantly sexual mode of transmission: Secondary | ICD-10-CM | POA: Diagnosis not present

## 2021-11-02 NOTE — Patient Instructions (Signed)
I will call with the blood test and CT of head results Get off the sertraline slowly.  Take one pill every other day for two weeks then stop. I will likely suggest another drug for memory that we should start two weeks after you off the sertraline.  You can give me a final answer about the new drug when we talk.

## 2021-11-03 ENCOUNTER — Encounter: Payer: Self-pay | Admitting: Family Medicine

## 2021-11-03 LAB — HIV ANTIBODY (ROUTINE TESTING W REFLEX): HIV Screen 4th Generation wRfx: NONREACTIVE

## 2021-11-03 LAB — RPR: RPR Ser Ql: NONREACTIVE

## 2021-11-03 NOTE — Assessment & Plan Note (Signed)
I trust his concern about mild cognitive impairment versus early stage dementia.  Had considerable bloodwork in June covering most of reversible causes.  I will get HIV and RPR to complete the WU even though he is at very low risk for these infections.  Also get non contrast head CT.

## 2021-11-03 NOTE — Progress Notes (Signed)
    SUBJECTIVE:   CHIEF COMPLAINT / HPI:   Concern for memory loss.  Had death of spouse.  Now lives alone.  At one point seen by geriatrics at [facility].  Started on low dose sertraline.  Has noticed progressive memory loss - short term memory.  He starts with high cognitive abilities.  He has developed strategies to help him cope.  Still concerned that if he gets more loss, it will threaten his ability to live independantly.  Does not endorse depressive sx. No hx of depression or anxiety prior to his wife's death. No loss of important functions.  Still drives and manages his own finances.   OBJECTIVE:   BP 128/74   Pulse 62   Ht 6\' 1"  (1.854 m)   Wt 160 lb 3.2 oz (72.7 kg)   SpO2 97%   BMI 21.14 kg/m   Lungs clear Cardiac RRR without m or g Neuro, motor, sensory gait, affect and cognition are grossly normal.  He does stumble with word finding at several points during history taking.  ASSESSMENT/PLAN:   Mild cognitive impairment I trust his concern about mild cognitive impairment versus early stage dementia.  Had considerable bloodwork in June covering most of reversible causes.  I will get HIV and RPR to complete the WU even though he is at very low risk for these infections.  Also get non contrast head CT.       Zenia Resides, MD [place]

## 2021-11-07 DIAGNOSIS — L821 Other seborrheic keratosis: Secondary | ICD-10-CM | POA: Diagnosis not present

## 2021-11-07 DIAGNOSIS — Z85828 Personal history of other malignant neoplasm of skin: Secondary | ICD-10-CM | POA: Diagnosis not present

## 2021-11-07 DIAGNOSIS — Z85820 Personal history of malignant melanoma of skin: Secondary | ICD-10-CM | POA: Diagnosis not present

## 2021-11-07 DIAGNOSIS — L570 Actinic keratosis: Secondary | ICD-10-CM | POA: Diagnosis not present

## 2021-11-07 DIAGNOSIS — D1801 Hemangioma of skin and subcutaneous tissue: Secondary | ICD-10-CM | POA: Diagnosis not present

## 2021-11-13 DIAGNOSIS — M79605 Pain in left leg: Secondary | ICD-10-CM | POA: Diagnosis not present

## 2021-11-13 DIAGNOSIS — M545 Low back pain, unspecified: Secondary | ICD-10-CM | POA: Diagnosis not present

## 2021-11-13 DIAGNOSIS — R269 Unspecified abnormalities of gait and mobility: Secondary | ICD-10-CM | POA: Diagnosis not present

## 2021-11-13 DIAGNOSIS — S86912D Strain of unspecified muscle(s) and tendon(s) at lower leg level, left leg, subsequent encounter: Secondary | ICD-10-CM | POA: Diagnosis not present

## 2021-12-04 ENCOUNTER — Telehealth: Payer: Self-pay | Admitting: *Deleted

## 2021-12-04 ENCOUNTER — Encounter: Payer: Self-pay | Admitting: Family Medicine

## 2021-12-04 NOTE — Telephone Encounter (Signed)
Contacted pt to give him his appt time for his CT.  PT is scheduled on 12/26/2021 at 3:00pm with a 2:40pm arrival for this imaging.  It will be at the [place] location of [facility]. Marland KitchenApril Zimmerman Rumple, CMA

## 2021-12-04 NOTE — Telephone Encounter (Signed)
-----   Message from Valerie Roys, [place] sent at 12/04/2021  1:59 PM EST ----- Regarding: CT scan Hey this patient called and said that he hasn't heard about an appointment for his CT scan.  Can you all check on this for him?    Thanks.  It doesn't require a prior [place]

## 2021-12-05 NOTE — Telephone Encounter (Signed)
Patient calls nurse line this morning stating that he was returning a missed call from this morning.   Provided clarification of appointment date and time. I was unable to see where our office was trying to reach him. Advised patient this could have been an automated call regarding appointment.   Talbot Grumbling, RN

## 2021-12-22 DIAGNOSIS — F331 Major depressive disorder, recurrent, moderate: Secondary | ICD-10-CM | POA: Diagnosis not present

## 2021-12-22 DIAGNOSIS — D696 Thrombocytopenia, unspecified: Secondary | ICD-10-CM | POA: Diagnosis not present

## 2021-12-22 DIAGNOSIS — Z833 Family history of diabetes mellitus: Secondary | ICD-10-CM | POA: Diagnosis not present

## 2021-12-22 DIAGNOSIS — Z604 Social exclusion and rejection: Secondary | ICD-10-CM | POA: Diagnosis not present

## 2021-12-22 DIAGNOSIS — Z825 Family history of asthma and other chronic lower respiratory diseases: Secondary | ICD-10-CM | POA: Diagnosis not present

## 2021-12-22 DIAGNOSIS — N529 Male erectile dysfunction, unspecified: Secondary | ICD-10-CM | POA: Diagnosis not present

## 2021-12-22 DIAGNOSIS — R030 Elevated blood-pressure reading, without diagnosis of hypertension: Secondary | ICD-10-CM | POA: Diagnosis not present

## 2021-12-22 DIAGNOSIS — Z811 Family history of alcohol abuse and dependence: Secondary | ICD-10-CM | POA: Diagnosis not present

## 2021-12-26 ENCOUNTER — Other Ambulatory Visit: Payer: Self-pay

## 2021-12-26 ENCOUNTER — Ambulatory Visit
Admission: RE | Admit: 2021-12-26 | Discharge: 2021-12-26 | Disposition: A | Discharge status: Home / Self Care | Payer: Medicare PPO | Source: Ambulatory Visit | Attending: Family Medicine | Admitting: Family Medicine

## 2021-12-26 DIAGNOSIS — G3184 Mild cognitive impairment, so stated: Secondary | ICD-10-CM

## 2021-12-26 DIAGNOSIS — R413 Other amnesia: Secondary | ICD-10-CM | POA: Diagnosis not present

## 2022-01-01 ENCOUNTER — Ambulatory Visit: Payer: Medicare PPO | Admitting: Family Medicine

## 2022-01-01 ENCOUNTER — Other Ambulatory Visit: Payer: Self-pay

## 2022-01-01 ENCOUNTER — Encounter: Payer: Self-pay | Admitting: Family Medicine

## 2022-01-01 DIAGNOSIS — G3184 Mild cognitive impairment, so stated: Secondary | ICD-10-CM | POA: Diagnosis not present

## 2022-01-01 MED ORDER — DONEPEZIL HCL 5 MG PO TABS: 5.0000 mg | ORAL_TABLET | Freq: Every day | ORAL | 1 refills | Status: DC

## 2022-01-01 NOTE — Progress Notes (Signed)
° ° °  SUBJECTIVE:   CHIEF COMPLAINT / HPI:   Follow up memory difficulties. Patient seems to have a progressive neurologic decline, very notable over the past 12 months.  Lives alone since the death of his wife, Lyndee Leo.  He has good strength and balance.  No recent falls.  Feels physically safe.  He is concerned about worsening short term memory.  Daughter, Delsa Sale, joined [place] by phone.  She is also concerned.  One example: He always maintained a spread sheet with income and expenses and did his own taxes.  He can no longer maintain that spread sheet and will have someone else do his taxes this year.  Another example, he had significant trouble following multistep instructions.    Review CT and labs: No evidence of any of the reversible causes of dementia.      OBJECTIVE:   BP 132/75    Pulse 60    Ht 6\' 1"  (1.854 m)    Wt 164 lb (74.4 kg)    SpO2 100%    BMI 21.64 kg/m   Motor, sensory and gait intact.  Stories are a little slow to come out.    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  Begin aricept.  Increase dose to 10 mg after one month if tolerating.   Nice discussion with him and daughter about his living alone, driving and having all affairs in order.  He will give daughter access to MyChart since it is one more thing that he now finds difficult to navigate.       Zenia Resides, MD [place]

## 2022-01-01 NOTE — Patient Instructions (Signed)
I am starting a new medication, donepizil.  You start at 5 mg daily for one month.  If you are not having side effects, we bump the dose to 10 mg daily. I would like to see you again in 2-3 months to tell me how this new medicine is working.  Hopefully, you will notice an improvement - perhaps to where you were 6 months ago.   There is a second medication that we will consider adding sometime in the near future.

## 2022-01-01 NOTE — Progress Notes (Signed)
Follow

## 2022-01-01 NOTE — Assessment & Plan Note (Signed)
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  Begin aricept.  Increase dose to 10 mg after one month if tolerating.   Nice discussion with him and daughter about his living alone, driving and having all affairs in order.  He will give daughter access to MyChart since it is one more thing that he now finds difficult to navigate.

## 2022-02-25 ENCOUNTER — Other Ambulatory Visit: Payer: Self-pay | Admitting: Family Medicine

## 2022-02-25 DIAGNOSIS — G3184 Mild cognitive impairment, so stated: Secondary | ICD-10-CM

## 2022-02-26 MED ORDER — DONEPEZIL HCL 10 MG PO TABS: 10.0000 mg | ORAL_TABLET | Freq: Every day | ORAL | 3 refills | Status: DC

## 2022-05-01 ENCOUNTER — Encounter: Payer: Self-pay | Admitting: *Deleted

## 2022-06-07 ENCOUNTER — Telehealth: Payer: Self-pay | Admitting: Family Medicine

## 2022-06-07 DIAGNOSIS — R29898 Other symptoms and signs involving the musculoskeletal system: Secondary | ICD-10-CM

## 2022-06-07 NOTE — Telephone Encounter (Signed)
Patient walked in stating he is being seen at [facility] for physical therapy. In order for [facility] to cover the sessions Dr. Andria Frames has to approve. Please notify [place] office for any approval so [employer] can be billed.

## 2022-06-11 NOTE — Telephone Encounter (Signed)
Called, no answer.  Left message that I would call back to clarify this request.

## 2022-06-12 ENCOUNTER — Telehealth: Payer: Self-pay

## 2022-06-12 NOTE — Telephone Encounter (Signed)
Patient called back.  Will forward to MD but I think patient will need a new referral for physical therapy.  His last referral was beginning of July 2022 and most insurances request a new one every 12 months.  Tyre Beaver,CMA

## 2022-06-12 NOTE — Telephone Encounter (Signed)
Called patient again x 3 no answer.  Left voice mail that I am happy to order PT but I must know what I am treating.  Instructed to call back and leave message with the nurse about what problem (leg, back, general weakness?) I am ordering PT for.

## 2022-06-12 NOTE — Telephone Encounter (Signed)
Patient's daughter calls nurse line requesting to speak with Dr. Andria Frames regarding father and dementia process.   Daughter reports that father has been having more impulsivity in decisions.   Patient's daughter would like to speak with Dr. Andria Frames whenever he is available.   Also informed daughter of message regarding PT from previous phone note.   Will forward to PCP.   Talbot Grumbling, RN

## 2022-06-13 NOTE — Telephone Encounter (Signed)
Called: Prefaced with confidentiality: that I could listen to her concerns and not speak specifically about her fathers matters.   She was concerned about her dad uncharacteristically making big decisions promptly.  The plans to sell his house and move to [facility]. She supports these decisions and worries about impulsivity. I explained that generally speaking, impulsivity was seen in dementia and warned her that that phase of impulsivity puts demented patients at high risk for scammers.   I also added that in general it is wise for grieving patients to put off big decisions for a year or so.  Once they get over the initial shock and confusion of the first year, it is very appropriate to make major decisions such as moving in response to the major life transition of loss of a spouse.   I also informed her that [place] has a Acupuncturist in our community. She said these general observations were quite helpful and she had no additional concerns at this time.

## 2022-06-13 NOTE — Telephone Encounter (Signed)
Patient stopped by.  With his mild cognitive impairment/early dementia, he avoids answering the phone.   Needs PT referral for left leg weakness.  Had one round of PT a year ago for left quad weakness with a good response.  Now having a recurrance with quad and left lateral thigh weakness and discomfort.  PT referral entered as requested.   He also informed me that he is likely selling his house and moving to [facility].

## 2022-06-19 DIAGNOSIS — M545 Low back pain, unspecified: Secondary | ICD-10-CM | POA: Diagnosis not present

## 2022-06-19 DIAGNOSIS — R269 Unspecified abnormalities of gait and mobility: Secondary | ICD-10-CM | POA: Diagnosis not present

## 2022-06-19 DIAGNOSIS — M79605 Pain in left leg: Secondary | ICD-10-CM | POA: Diagnosis not present

## 2022-07-08 IMAGING — CT CT HEAD W/O CM
4 series · 16 of 47 positions shown, 18 images · non-contrast
Comparison: None.

CLINICAL DATA: Memory loss memory loss - mild cognitive impairment



[Series 2: head 5.00 hr40 s3 axial ibhc · axial · 0.48mm/px · z∈[-619,-494]mm · 7 of 35 slices shown, 9 images]
[im 5/35  brain]
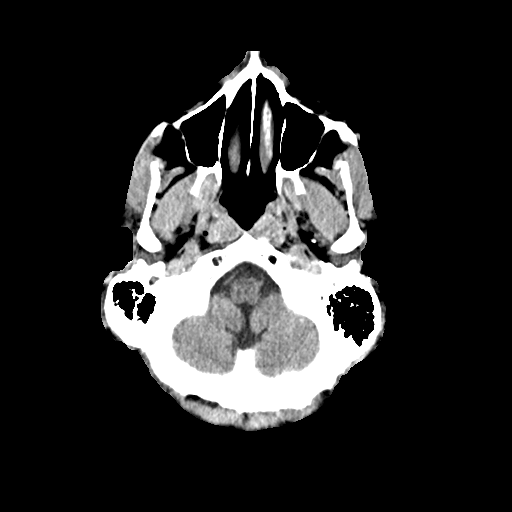
[im 5/35  bone]
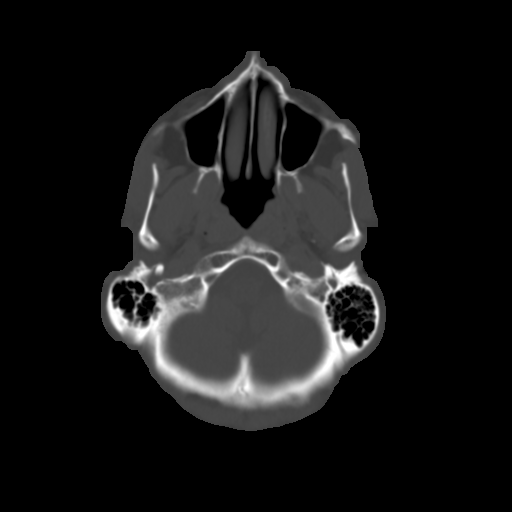
[im 9/35  brain]
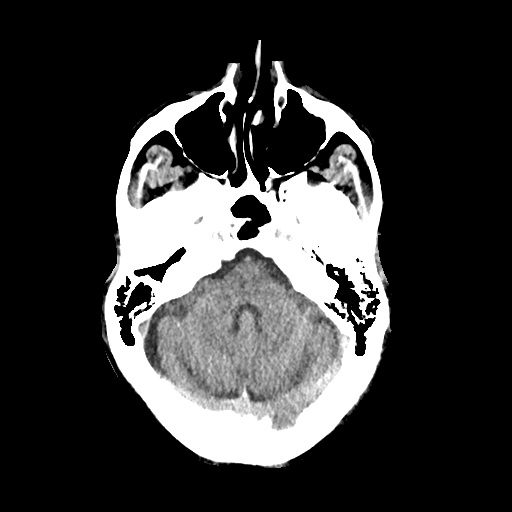
[im 13/35  brain]
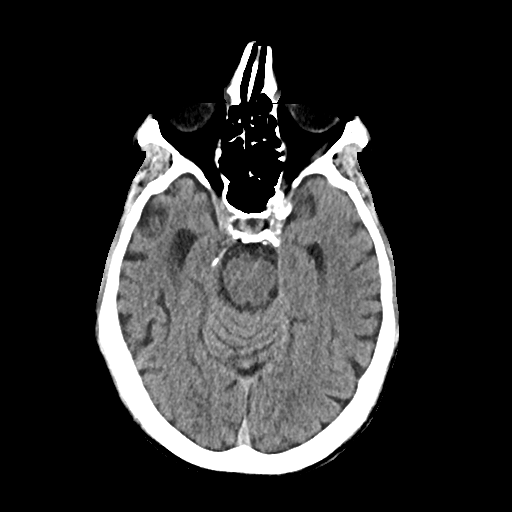
[im 18/35  brain]
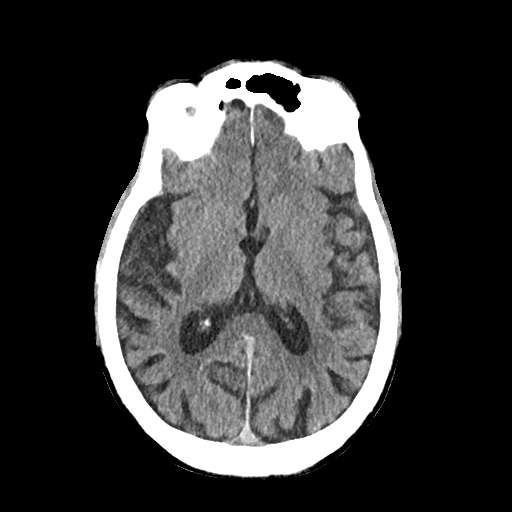
[im 22/35  brain]
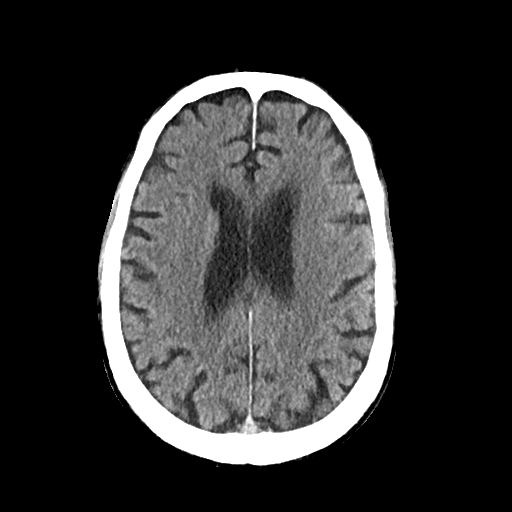
[im 22/35  bone]
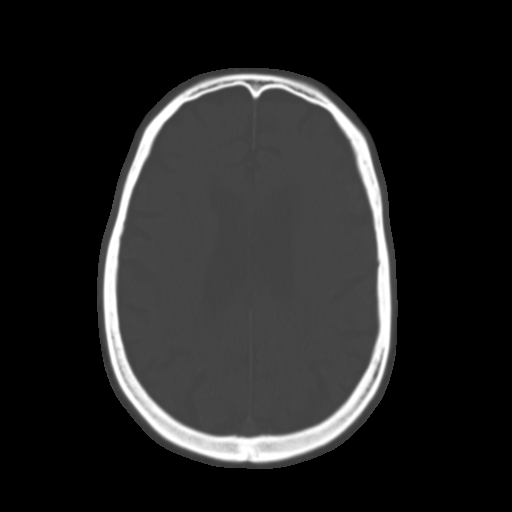
[im 26/35  brain]
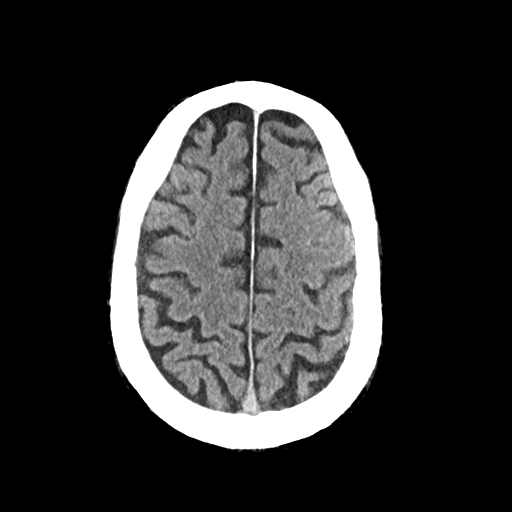
[im 30/35  brain]
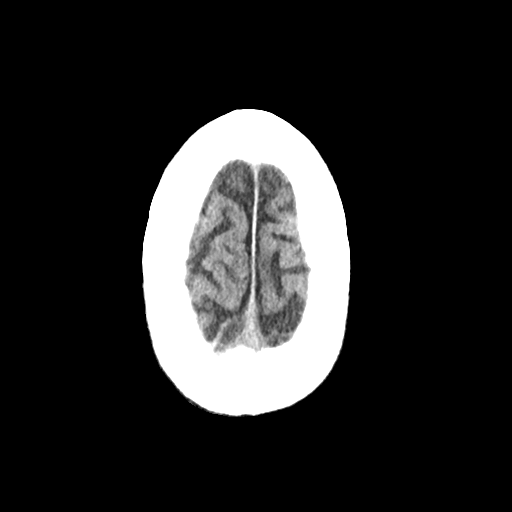

[Series 3: head 2.00 hr60 s3 axial bone · axial · 0.49mm/px · z∈[-625,-589]mm · 3 of 88 slices shown]
[im 9/88  bone]
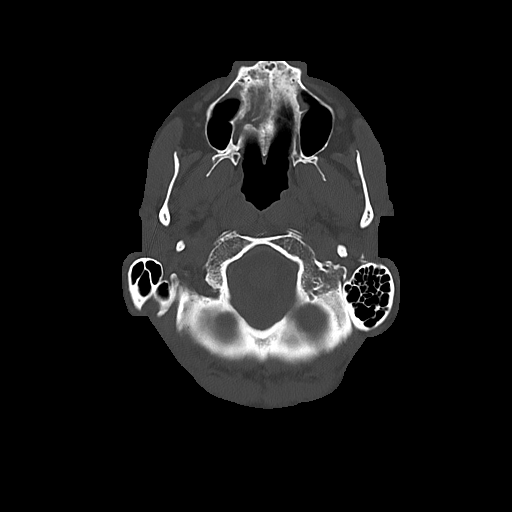
[im 18/88  bone]
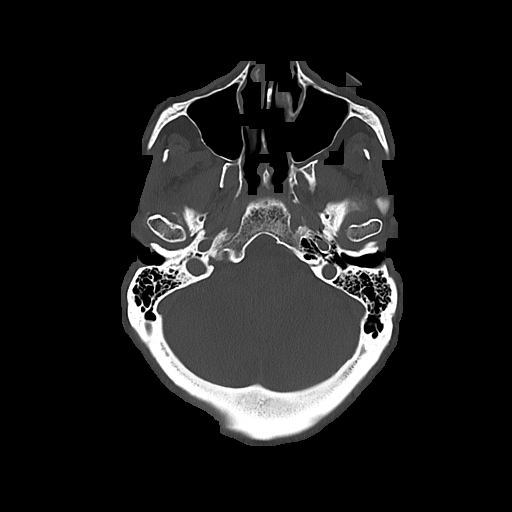
[im 27/88  bone]
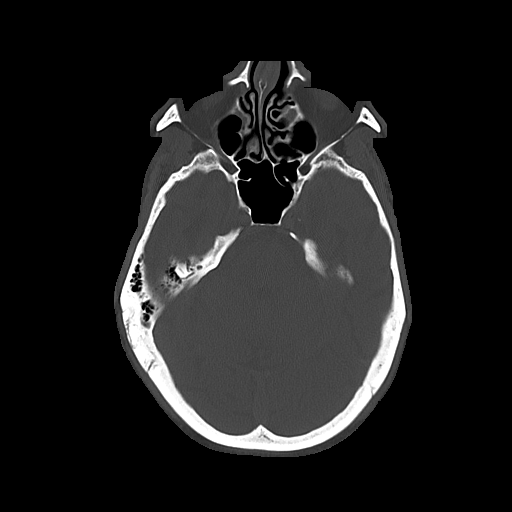

[Series 4: head 3.00 hr40 s3 sag · sagittal · 0.39mm/px · 3 of 57 slices shown]
[im 19/57  brain]
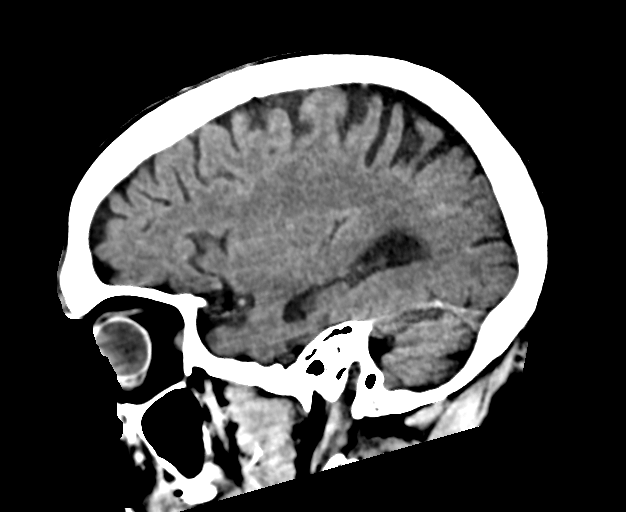
[im 29/57  brain]
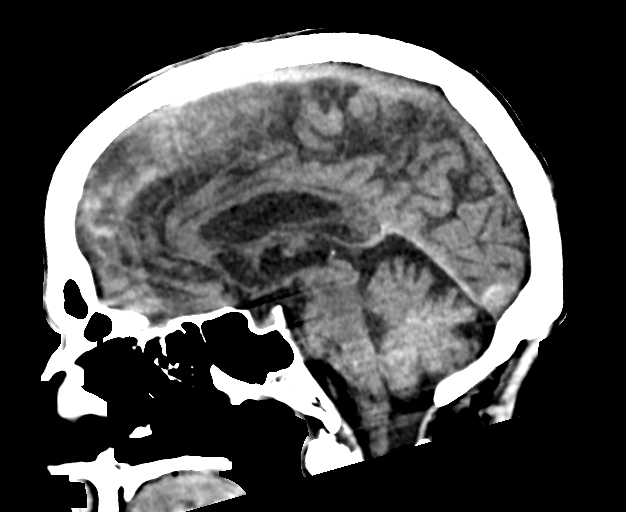
[im 38/57  brain]
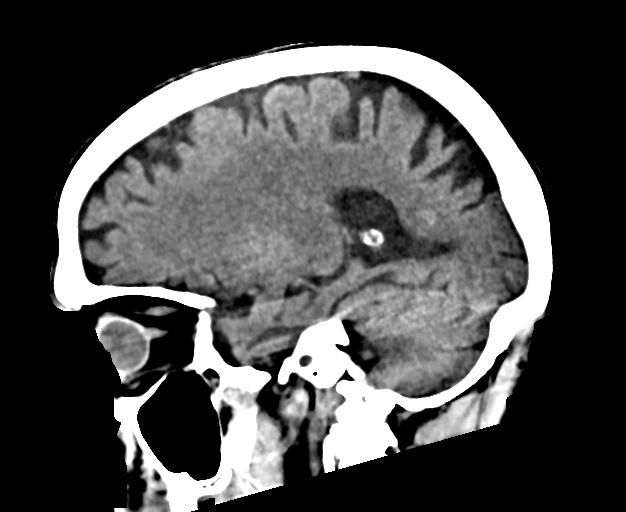

[Series 6: head 3.00 hr40 s3 cor · coronal · 0.34mm/px · 3 of 80 slices shown]
[im 27/80  brain]
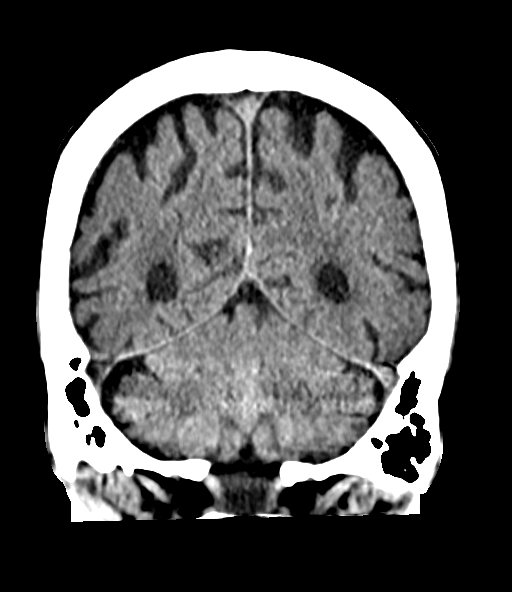
[im 36/80  brain]
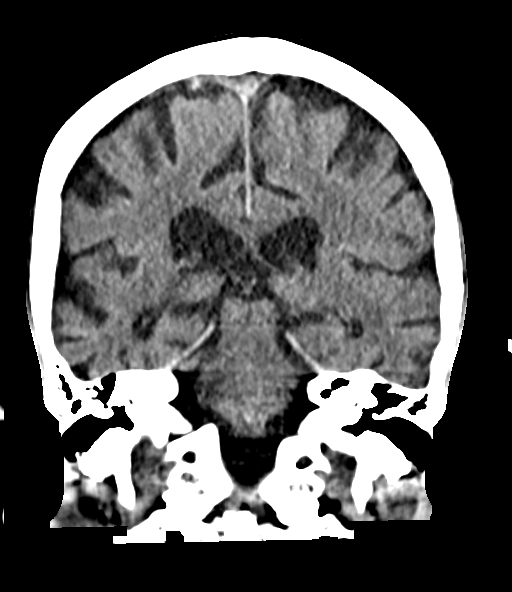
[im 44/80  brain]
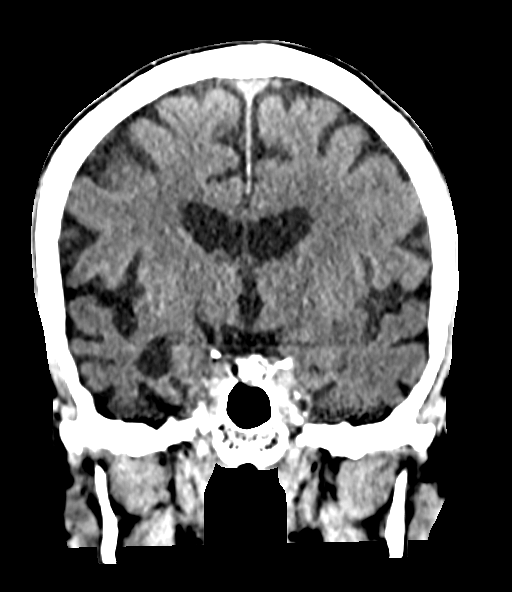

[16 of 47 positions shown; findings below may reference images not displayed]

BRAIN:
BRAIN
Cerebral ventricle sizes are concordant with the degree of cerebral
volume loss. Patchy and confluent areas of decreased attenuation are
noted throughout the deep and periventricular white matter of the
cerebral hemispheres bilaterally, compatible with chronic
microvascular ischemic disease.

No evidence of large-territorial acute infarction. No parenchymal
hemorrhage. No mass lesion. No extra-axial collection.

No mass effect or midline shift. No hydrocephalus. Basilar cisterns
are patent.

Vascular: No hyperdense vessel.

Skull: No acute fracture or focal lesion.

Sinuses/Orbits: Paranasal sinuses and mastoid air cells are clear.
Bilateral lens replacement. The orbits are unremarkable.

Other: None.
IMPRESSION: No acute intracranial abnormality.

## 2022-07-10 ENCOUNTER — Telehealth: Payer: Self-pay | Admitting: Family Medicine

## 2022-07-10 NOTE — Telephone Encounter (Signed)
Patient dropped off form for [facility] to be completed. Last DOS was 01/01/22. Placed in [employer]

## 2022-07-12 NOTE — Telephone Encounter (Signed)
Clinical info completed on [facility] form.  Placed form in Dr. Lowella Bandy box for completion.    When form is completed, please route note to "RN Team" and place in wall pocket in front office.   Ottis Stain, CMA

## 2022-07-12 NOTE — Telephone Encounter (Signed)
Form completed as requested and placed in RN box.

## 2022-07-16 NOTE — Telephone Encounter (Signed)
Form faxed.   Copy made for batch scanning.  

## 2022-07-17 ENCOUNTER — Telehealth: Payer: Self-pay | Admitting: Family Medicine

## 2022-07-17 NOTE — Telephone Encounter (Signed)
[facility] is calling concerning the paperwork faxed back to the yesterday 07/16/22. Everything on the form is correct but there was one section that was not completed. At the bottom there are boxed to check letting them know the level of care patient should qualify for.   I have placed form back in Dr. Mike Craze box. When completed please place back in "to be faxed" pile.

## 2022-07-17 NOTE — Telephone Encounter (Signed)
Form completed as requested and placed in the to-be-faxed pile.

## 2022-07-17 NOTE — Addendum Note (Signed)
Addended by: Zenia Resides on: 07/17/2022 04:03 PM   Modules accepted: Orders

## 2022-08-21 ENCOUNTER — Other Ambulatory Visit: Payer: Self-pay | Admitting: Family Medicine

## 2022-08-21 DIAGNOSIS — G3184 Mild cognitive impairment, so stated: Secondary | ICD-10-CM

## 2022-10-11 ENCOUNTER — Ambulatory Visit (INDEPENDENT_AMBULATORY_CARE_PROVIDER_SITE_OTHER): Payer: Medicare PPO | Admitting: Family Medicine

## 2022-10-11 ENCOUNTER — Encounter: Payer: Self-pay | Admitting: Family Medicine

## 2022-10-11 VITALS — BP 142/70 | HR 58 | Ht 73.0 in | Wt 158.0 lb

## 2022-10-11 DIAGNOSIS — J310 Chronic rhinitis: Secondary | ICD-10-CM | POA: Insufficient documentation

## 2022-10-11 MED ORDER — AZELASTINE HCL 0.1 % NA SOLN: 2.0000 | Freq: Two times a day (BID) | NASAL | 12 refills | Status: DC

## 2022-10-11 NOTE — Progress Notes (Signed)
   SUBJECTIVE:   CHIEF COMPLAINT / HPI:   Stephen [name] is a 81 y.o. male with a past medical history of bradycardia and mild cognitive impairment presenting to the clinic for 2-3 weeks of runny nose.  Rhinitis Patient presents with 2 to 3 weeks of runny nose with periodic drainage of nonbloody clear discharge and no associated congestion.  Patient believes symptoms are worse later in the afternoon and after eating.  He denies any headaches, ear pain, pain in sinuses, fevers, cough, arthralgias, or shortness of breath.  She notes an occasional feeling of postnasal drip causing him throat discomfort a few times a day, but no throat pain at baseline.  Occasionally feels like he needs to cough something up but is unable to produce any phlegm or other substance.  He exercises at the gym and in an indoor swimming pool frequently and has been able to keep up his normal level of activity currently.  She normally uses hearing aids and swims of earplugs to use Q-tips in his ears after swimming.  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  He denies any current allergic symptoms such as sneezing, tearful/itchy eyes, or rashes.   PERTINENT  PMH / PSH: Patient lives with wife.  Daughter is an OB/GYN in [place] and thought he should get checked out.  Patient Care Team: Zenia Resides, MD as PCP - General (Family Medicine) Barbaraann Cao, [place] as Referring Physician (Optometry) Particia Nearing, MD (Dermatology) Mottinger, Sharyn Lull (Dentistry) Clarene Essex, MD as Attending Physician (Gastroenterology)  OBJECTIVE:   BP (!) 142/70   Pulse (!) 58   Ht '6\' 1"'$  (1.854 m)   Wt 158 lb (71.7 kg)   SpO2 98%   BMI 20.85 kg/m   General: Age-appropriate, resting comfortably in chair, no acute distress. Alert and at baseline. HEENT:  Head: Normocephalic, atraumatic. No tenderness to percussion over sinuses. Eyes: PERRLA. Sclera without  injection or icterus. Ears: TMs non-bulging and non-erythematous bilaterally. No erythema of external ear canal. No cerumen impaction. Wears hearing aids. Nose: Mild erythema of nasal turbinates. Mouth/Oral: Clear, no tonsillar exudate. MMM. Neck: Supple. No LAD, thyroid smooth. Cardiovascular:  Bradycardia. Regular rhythm. Normal S1/S2. No murmurs, rubs, or gallops appreciated. 2+ radial pulses. Pulmonary: Clear bilaterally to ascultation. No increased WOB, no accessory muscle usage. No wheezes, rales, or crackles. Skin: Warm and dry.  Sun spots on arms and face.  Single skin tag directly below left eye.  No rashes. Extremities: No peripheral edema bilaterally. Neuro: Steady gait.  Alert and oriented x4.  No focal deficits.  Pauses occasionally in conversation to search for right word for prolonged period of time.  {Show previous vital signs (optional):23777}    ASSESSMENT/PLAN:   Rhinitis Patient's physical exam and symptoms are consistent with rhinitis and differential includes allergic rhinitis versus vasomotor rhinitis.  Patient's association of postprandial worsening of symptoms may specifically indicate vasomotor rhinitis.  Given this differential, azelastine nasal spray will serve while to treat both possible conditions simultaneously.  Patient's physical exam and lack of fevers, cough, other respiratory symptoms, or sinus pressure/discomfort are reassuring for lack of infectious etiology such as viral URI, sinusitis, bronchitis, or pneumonia. - Start azelastine 0.1% nasal spray twice in each nostril BID - Contact clinic or return in 2-4 weeks if symptoms are not improving  Lior Cartelli Mining engineer, [place]

## 2022-10-11 NOTE — Patient Instructions (Addendum)
I believe your runny nose and post-nasal drip is due to inflammation, either from allergy and vasomotor in origin.  Please start Azelastine nasal spray 2 spray into each nostril and twice twice a day.  Please let me know if this is not sufficiently effective.

## 2022-10-11 NOTE — Assessment & Plan Note (Signed)
Patient's physical exam and symptoms are consistent with rhinitis and differential includes allergic rhinitis versus vasomotor rhinitis.  Patient's association of postprandial worsening of symptoms may specifically indicate vasomotor rhinitis.  Given this differential, azelastine nasal spray will serve while to treat both possible conditions simultaneously.  Patient's physical exam and lack of fevers, cough, other respiratory symptoms, or sinus pressure/discomfort are reassuring for lack of infectious etiology such as viral URI, sinusitis, bronchitis, or pneumonia. - Start azelastine 0.1% nasal spray twice in each nostril BID - Contact clinic or return in 2-4 weeks if symptoms are not improving

## 2022-10-12 ENCOUNTER — Encounter: Payer: Self-pay | Admitting: Family Medicine

## 2022-10-12 NOTE — Progress Notes (Signed)
I have interviewed and examined the Dr Leamon Arnt with [facility].   I agree with their documentation and management in their note for today.   There is rhinitis, but it is difficult to distinguish if there is allergic or vasomotor component or both to it.  Agree with trial of azelastine NS.  If not improved, he will need re-evaluation.

## 2022-11-05 DIAGNOSIS — D225 Melanocytic nevi of trunk: Secondary | ICD-10-CM | POA: Diagnosis not present

## 2022-11-05 DIAGNOSIS — Z85820 Personal history of malignant melanoma of skin: Secondary | ICD-10-CM | POA: Diagnosis not present

## 2022-11-05 DIAGNOSIS — D485 Neoplasm of uncertain behavior of skin: Secondary | ICD-10-CM | POA: Diagnosis not present

## 2022-11-05 DIAGNOSIS — L570 Actinic keratosis: Secondary | ICD-10-CM | POA: Diagnosis not present

## 2022-11-05 DIAGNOSIS — Z85828 Personal history of other malignant neoplasm of skin: Secondary | ICD-10-CM | POA: Diagnosis not present

## 2022-11-05 DIAGNOSIS — D0422 Carcinoma in situ of skin of left ear and external auricular canal: Secondary | ICD-10-CM | POA: Diagnosis not present

## 2022-11-05 DIAGNOSIS — L821 Other seborrheic keratosis: Secondary | ICD-10-CM | POA: Diagnosis not present

## 2022-12-13 DIAGNOSIS — H52223 Regular astigmatism, bilateral: Secondary | ICD-10-CM | POA: Diagnosis not present

## 2022-12-13 DIAGNOSIS — H524 Presbyopia: Secondary | ICD-10-CM | POA: Diagnosis not present

## 2022-12-13 DIAGNOSIS — H35373 Puckering of macula, bilateral: Secondary | ICD-10-CM | POA: Diagnosis not present

## 2022-12-13 DIAGNOSIS — H5203 Hypermetropia, bilateral: Secondary | ICD-10-CM | POA: Diagnosis not present

## 2022-12-13 DIAGNOSIS — H53143 Visual discomfort, bilateral: Secondary | ICD-10-CM | POA: Diagnosis not present

## 2022-12-13 DIAGNOSIS — Z9849 Cataract extraction status, unspecified eye: Secondary | ICD-10-CM | POA: Diagnosis not present

## 2022-12-13 DIAGNOSIS — Z961 Presence of intraocular lens: Secondary | ICD-10-CM | POA: Diagnosis not present

## 2022-12-18 DIAGNOSIS — R41841 Cognitive communication deficit: Secondary | ICD-10-CM | POA: Diagnosis not present

## 2022-12-21 DIAGNOSIS — R41841 Cognitive communication deficit: Secondary | ICD-10-CM | POA: Diagnosis not present

## 2022-12-24 DIAGNOSIS — R41841 Cognitive communication deficit: Secondary | ICD-10-CM | POA: Diagnosis not present

## 2022-12-28 DIAGNOSIS — R41841 Cognitive communication deficit: Secondary | ICD-10-CM | POA: Diagnosis not present

## 2022-12-31 DIAGNOSIS — R41841 Cognitive communication deficit: Secondary | ICD-10-CM | POA: Diagnosis not present

## 2023-01-01 ENCOUNTER — Encounter: Payer: Self-pay | Admitting: Family Medicine

## 2023-01-02 DIAGNOSIS — R41841 Cognitive communication deficit: Secondary | ICD-10-CM | POA: Diagnosis not present

## 2023-01-07 DIAGNOSIS — R41841 Cognitive communication deficit: Secondary | ICD-10-CM | POA: Diagnosis not present

## 2023-01-09 DIAGNOSIS — R41841 Cognitive communication deficit: Secondary | ICD-10-CM | POA: Diagnosis not present

## 2023-01-11 DIAGNOSIS — R41841 Cognitive communication deficit: Secondary | ICD-10-CM | POA: Diagnosis not present

## 2023-01-14 DIAGNOSIS — R41841 Cognitive communication deficit: Secondary | ICD-10-CM | POA: Diagnosis not present

## 2023-01-16 DIAGNOSIS — R41841 Cognitive communication deficit: Secondary | ICD-10-CM | POA: Diagnosis not present

## 2023-01-18 DIAGNOSIS — R41841 Cognitive communication deficit: Secondary | ICD-10-CM | POA: Diagnosis not present

## 2023-01-21 DIAGNOSIS — R41841 Cognitive communication deficit: Secondary | ICD-10-CM | POA: Diagnosis not present

## 2023-01-23 DIAGNOSIS — R41841 Cognitive communication deficit: Secondary | ICD-10-CM | POA: Diagnosis not present

## 2023-01-25 DIAGNOSIS — R41841 Cognitive communication deficit: Secondary | ICD-10-CM | POA: Diagnosis not present

## 2023-01-28 DIAGNOSIS — R41841 Cognitive communication deficit: Secondary | ICD-10-CM | POA: Diagnosis not present

## 2023-01-30 DIAGNOSIS — R41841 Cognitive communication deficit: Secondary | ICD-10-CM | POA: Diagnosis not present

## 2023-02-01 DIAGNOSIS — R41841 Cognitive communication deficit: Secondary | ICD-10-CM | POA: Diagnosis not present

## 2023-02-04 DIAGNOSIS — R41841 Cognitive communication deficit: Secondary | ICD-10-CM | POA: Diagnosis not present

## 2023-02-06 DIAGNOSIS — R41841 Cognitive communication deficit: Secondary | ICD-10-CM | POA: Diagnosis not present

## 2023-02-08 DIAGNOSIS — R41841 Cognitive communication deficit: Secondary | ICD-10-CM | POA: Diagnosis not present

## 2023-02-11 DIAGNOSIS — R41841 Cognitive communication deficit: Secondary | ICD-10-CM | POA: Diagnosis not present

## 2023-02-13 DIAGNOSIS — R41841 Cognitive communication deficit: Secondary | ICD-10-CM | POA: Diagnosis not present

## 2023-02-27 ENCOUNTER — Other Ambulatory Visit: Payer: Self-pay

## 2023-02-27 DIAGNOSIS — G3184 Mild cognitive impairment, so stated: Secondary | ICD-10-CM

## 2023-03-01 ENCOUNTER — Other Ambulatory Visit: Payer: Self-pay

## 2023-03-01 DIAGNOSIS — G3184 Mild cognitive impairment, so stated: Secondary | ICD-10-CM

## 2023-03-01 MED ORDER — DONEPEZIL HCL 10 MG PO TABS: 10.0000 mg | ORAL_TABLET | Freq: Every day | ORAL | 0 refills | Status: DC

## 2023-03-01 NOTE — Telephone Encounter (Signed)
Patient's daughter calls nurse line regarding request for prescription refill. Patient has about five days left.   If appointment is needed, daughter feels that patient would do better to wait until he can schedule with Dr. Linwood Dibbles. Daughter states that seeing two different providers would be confusing for patient.   Please advise.    Veronda Prude, RN

## 2023-03-22 ENCOUNTER — Telehealth: Payer: Self-pay | Admitting: Family Medicine

## 2023-03-22 NOTE — Telephone Encounter (Signed)
Contacted Stephen [name] to schedule their annual wellness visit. Appointment made for 03/28/2023.  Thank you,  [facility] Support [facility] Direct dial  [phone]

## 2023-03-28 ENCOUNTER — Ambulatory Visit (INDEPENDENT_AMBULATORY_CARE_PROVIDER_SITE_OTHER): Payer: Medicare PPO

## 2023-03-28 DIAGNOSIS — Z0000 Encounter for general adult medical examination without abnormal findings: Secondary | ICD-10-CM | POA: Diagnosis not present

## 2023-03-28 NOTE — Progress Notes (Signed)
I connected with  Stephen [name] on 03/28/23 by a audio enabled telemedicine application and verified that I am speaking with the correct person using two identifiers.  Patient Location: Home  Provider Location: Home Office  I discussed the limitations of evaluation and management by telemedicine. The patient expressed understanding and agreed to proceed.   Subjective:   Stephen [name] is a 82 y.o. male who presents for Medicare Annual/Subsequent preventive examination.  Review of Systems    Per HPI unless specifically indicated below.  Cardiac Risk Factors include: advanced age (>91men, >44 women);male gender        Objective:       10/11/2022    1:56 PM 01/01/2022    9:19 AM 11/02/2021    9:06 AM  Vitals with BMI  Height 6\' 1"  6\' 1"  6\' 1"   Weight 158 lbs 164 lbs 160 lbs 3 oz  BMI 20.85 21.64 21.14  Systolic 142 132 161  Diastolic 70 75 74  Pulse 58 60 62    There were no vitals filed for this visit. There is no height or weight on file to calculate BMI.     03/28/2023    3:40 PM 10/11/2022    1:59 PM 11/02/2021    9:09 AM 05/11/2021   10:44 AM 10/21/2019   11:36 AM 08/26/2018    3:41 PM 04/09/2018    1:49 PM  Advanced Directives  Does Patient Have a Medical Advance Directive? Yes Yes No No Yes No No  Type of Estate agent of [place];Living will Living will   Living will;Healthcare Power of Attorney    Does patient want to make changes to medical advance directive? No - Patient declined No - Patient declined   No - Patient declined    Copy of Healthcare Power of Attorney in Chart?     Yes - validated most recent copy scanned in chart (See row information)    Would patient like information on creating a medical advance directive?   No - Patient declined No - Patient declined  No - Patient declined No - Patient declined    Current Medications (verified) Outpatient Encounter Medications as of 03/28/2023  Medication Sig   donepezil (ARICEPT)  10 MG tablet Take 1 tablet (10 mg total) by mouth at bedtime.   acetaminophen (TYLENOL) 500 MG tablet Take 500 mg by mouth every 6 (six) hours as needed. (Patient not taking: Reported on 10/11/2022)   azelastine (ASTELIN) 0.1 % nasal spray Place 2 sprays into both nostrils 2 (two) times daily. Use in each nostril as directed (Patient not taking: Reported on 03/28/2023)   Calcium Carb-Cholecalciferol (CALCIUM-VITAMIN D) 500-200 MG-UNIT tablet Take 1 tablet by mouth daily. (Patient not taking: Reported on 03/28/2023)   hydrocortisone valerate cream (WESTCORT) 0.2 % Apply 1 application topically daily as needed (dry skin).  (Patient not taking: Reported on 10/11/2022)   No facility-administered encounter medications on file as of 03/28/2023.    Allergies (verified) Naproxen   History: Past Medical History:  Diagnosis Date   Medical history non-contributory    Melanoma in situ of back (HCC) 2015   Past Surgical History:  Procedure Laterality Date   AXILLARY SENTINEL NODE BIOPSY Right 03/05/2013   Procedure: AXILLARY SENTINEL NODE BIOPSY;  Surgeon: Emelia Loron, MD;  Location: [facility] OR;  Service: General;  Laterality: Right;   basal /sq cell removal  2014   x-4 times removal   EXCISION MELANOMA WITH SENTINEL LYMPH NODE BIOPSY Right  03/05/2013   Procedure: WIDE LOCAL EXCISION RIGHT UPPER BACK  MELANOMA WITH RIGHT AXILLARYSENTINEL NODE BIOPSY;  Surgeon: Emelia Loron, MD;  Location: [facility] OR;  Service: General;  Laterality: Right;   Family History  Problem Relation Age of Onset   Heart disease Mother    Diabetes Mother        died age    Arthritis Mother    Glaucoma Father    Heart disease Father    Arthritis Father    Stroke Father    Cancer Sister        Lung CA   Arthritis Brother    Social History   Socioeconomic History   Marital status: Widowed    Spouse name: Alan Ripper   Number of children: 2   Years of education: 20+   Highest education level: Not on file  Occupational  History   Occupation: Retired-Economic Faculty    Comment: [employer]  Tobacco Use   Smoking status: Never   Smokeless tobacco: Never  Vaping Use   Vaping Use: Never used  Substance and Sexual Activity   Alcohol use: Not Currently    Comment: daily   Drug use: No   Sexual activity: Yes  Other Topics Concern   Not on file  Social History Narrative   Health Care POA:    Emergency Contact: spouse, Alan Ripper [phone] being treated for Multiple Myeloma   End of Life Plan:        Diet: Patient has a varied diet of protein, vegetable, starch.  Limited red meat   Exercise: Patient exercises a minimum of 3 times a week for 2 hours.   Seatbelts: Patient reports wearing seatbelt when in vehicle.    Wynelle Link Exposure/Protection: Patient reports wearing sunscreen daily.   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 Leward Quan, RN, BSN                                                                                                          Social Determinants of Health   Financial Resource Strain: Low Risk  (03/28/2023)   Overall Financial Resource Strain (CARDIA)    Difficulty of Paying Living Expenses: Not hard at all   Food Insecurity: No Food Insecurity (03/28/2023)   Hunger Vital  Sign    Worried About Programme researcher, broadcasting/film/video in the Last Year: Never true    Ran Out of Food in the Last Year: Never true  Transportation Needs: No Transportation Needs (03/28/2023)   PRAPARE - Administrator, Civil Service (Medical): No    Lack of Transportation (Non-Medical): No  Physical Activity: Sufficiently Active (03/28/2023)   Exercise Vital Sign    Days of Exercise per Week: 3 days    Minutes of Exercise per Session: 60 min  Stress: No Stress Concern Present (03/28/2023)   [employer] of Occupational Health - Occupational Stress Questionnaire    Feeling of Stress : Not at all  Social Connections: Moderately Isolated (03/28/2023)   Social Connection and Isolation Panel [NHANES]    Frequency of Communication with Friends and Family: Twice a week    Frequency of Social Gatherings with Friends and Family: More than three times a week    Attends Religious Services: Never    Database administrator or Organizations: Yes    Attends Engineer, structural: More than 4 times per year    Marital Status: Widowed    Tobacco Counseling Counseling given: No   Clinical Intake:  Pre-visit preparation completed: No  Pain : No/denies pain     Nutritional Status: BMI of 19-24  Normal Nutritional Risks: None Diabetes: No  How often do you need to have someone help you when you read instructions, pamphlets, or other written materials from your doctor or pharmacy?: 1 - Never  Diabetic? No  Interpreter Needed?: No  Information entered by :: Laurel Dimmer, CMA   Activities of Daily Living    03/28/2023    3:25 PM  In your present state of health, do you have any difficulty performing the following activities:  Hearing? 1  Comment bilateral hearing  Vision? 0  Comment 11/2022, Therese Sarah  Difficulty concentrating or making decisions? 1  Walking or climbing stairs? 0  Dressing or bathing? 0   Doing errands, shopping? 0    Patient Care Team: Latrelle Dodrill, MD as PCP - General (Family Medicine) Antonietta Barcelona, OD as Referring Physician (Optometry) Alanson Puls, MD (Dermatology) Mottinger, Marcelino Duster (Dentistry) Vida Rigger, MD as Attending Physician (Gastroenterology)  Indicate any recent Medical Services you may have received from other than [facility] providers in the past year (date may be approximate).     Assessment:   This is a routine wellness examination for [employer].  Hearing/Vision screen Denies any hearing issues. Denies any vision changes. Annual Eye Exam Dietary issues and exercise activities discussed: Current Exercise Habits: Structured exercise class, Type of exercise: strength training/weights;yoga;Other - see comments Education officer, museum), Time (Minutes): 60, Frequency (Times/Week): 3, Weekly Exercise (Minutes/Week): 180, Intensity: Moderate, Exercise limited by: None identified   Goals Addressed   None    Depression Screen    03/28/2023    3:32 PM 10/11/2022    1:57 PM 01/01/2022    9:20 AM 11/02/2021    9:09 AM 05/11/2021   10:51 AM 10/20/2019   11:25 AM 08/26/2018    3:41 PM  PHQ 2/9 Scores  PHQ - 2 Score 0 2 2 1 2  0 0  PHQ- 9 Score  3 4 1 4       Fall Risk    03/28/2023    3:25 PM 10/11/2022    1:57 PM 05/11/2021   10:44 AM 10/21/2019   11:38 AM 10/20/2019   11:25 AM  Fall Risk   Falls  in the past year? 0 0 0 0 0  Number falls in past yr: 0 0     Injury with Fall? 0 0     Risk for fall due to : No Fall Risks  No Fall Risks    Follow up Falls evaluation completed Falls evaluation completed Falls prevention discussed      FALL RISK PREVENTION PERTAINING TO THE HOME:  Any stairs in or around the home? Yes  If so, are there any without handrails? No  Home free of loose throw rugs in walkways, pet beds, electrical cords, etc? Yes  Adequate lighting in your home to reduce risk of falls? Yes   ASSISTIVE DEVICES UTILIZED TO PREVENT  FALLS:  Life alert? No  Use of a cane, walker or w/c? No  Grab bars in the bathroom? Yes  Shower chair or bench in shower? No  Elevated toilet seat or a handicapped toilet? No   TIMED UP AND GO:  Was the test performed?Unable to perform, virtual appointment   Cognitive Function:    03/06/2012    9:00 AM 05/09/2011    4:00 PM  MMSE - Mini Mental State Exam  Orientation to time 5 5  Orientation to Place 5 5  Registration 3 3  Attention/ Calculation 5 5  Recall 3 3  Language- name 2 objects 2 2  Language- repeat 1 1  Language- follow 3 step command 3 3  Language- read & follow direction 1 1  Write a sentence 1 1  Copy design 1 1  Total score 30 30        03/28/2023    3:37 PM 10/20/2019   11:28 AM  6CIT Screen  What Year? 4 points 0 points  What month? 0 points 0 points  What time? 0 points 0 points  Count back from 20 0 points 0 points  Months in reverse 0 points 0 points  Repeat phrase 10 points 0 points  Total Score 14 points 0 points    Immunizations Immunization History  Administered Date(s) Administered   Fluad Quad(high Dose 65+) 07/22/2019   Influenza Whole 10/14/2009   Influenza, High Dose Seasonal PF 08/27/2016, 08/30/2017   Influenza,inj,Quad PF,6+ Mos 09/23/2013, 08/26/2018   Influenza-Unspecified 09/09/2015, 08/26/2017, 09/01/2020, 08/26/2022   Moderna Covid-19 Vaccine Bivalent Booster 35yrs & up 03/29/2022   Moderna Sars-Covid-2 Vaccination 08/26/2022   PFIZER Comirnaty(Gray Top)Covid-19 Tri-Sucrose Vaccine 05/11/2021   PFIZER(Purple Top)SARS-COV-2 Vaccination 12/18/2019, 01/08/2020, 09/19/2020   Pneumococcal Conjugate-13 09/15/2015   Pneumococcal Polysaccharide-23 10/14/2009   Td 10/14/2009   Tdap 05/11/2021   Zoster Recombinat (Shingrix) 07/22/2019, 05/30/2022    TDAP status: Up to date  Flu Vaccine status: Up to date  Pneumococcal vaccine status: Up to date  Covid-19 vaccine status: Information provided on how to obtain vaccines.    Qualifies for Shingles Vaccine? Yes   Zostavax completed Yes   Shingrix Completed?: Yes  Screening Tests Health Maintenance  Topic Date Due   COVID-19 Vaccine (7 - 2023-24 season) 10/21/2022   INFLUENZA VACCINE  06/27/2023   COLONOSCOPY (Pts 45-75yrs Insurance coverage will need to be confirmed)  09/04/2023   Medicare Annual Wellness (AWV)  03/27/2024   DTaP/Tdap/Td (3 - Td or Tdap) 05/12/2031   Pneumonia Vaccine 20+ Years old  Completed   Zoster Vaccines- Shingrix  Completed   HPV VACCINES  Aged Out    Health Maintenance  Health Maintenance Due  Topic Date Due   COVID-19 Vaccine (7 - 2023-24 season) 10/21/2022  Colorectal cancer screening: No longer required.   Lung Cancer Screening: (Low Dose CT Chest recommended if Age 5-80 years, 30 pack-year currently smoking OR have quit w/in 15years.) does not qualify.   Lung Cancer Screening Referral: not applicable  Additional Screening:  Hepatitis C Screening: does not qualify  Vision Screening: Recommended annual ophthalmology exams for early detection of glaucoma and other disorders of the eye. Is the patient up to date with their annual eye exam?  Yes  Who is the provider or what is the name of the office in which the patient attends annual eye exams? Dr. Hyacinth Meeker  If pt is not established with a provider, would they like to be referred to a provider to establish care? No .   Dental Screening: Recommended annual dental exams for proper oral hygiene  Community Resource Referral / Chronic Care Management: CRR required this visit?  No   CCM required this visit?  No      Plan:     I have personally reviewed and noted the following in the patient's chart:   Medical and social history Use of alcohol, tobacco or illicit drugs  Current medications and supplements including opioid prescriptions. Patient is not currently taking opioid prescriptions. Functional ability and status Nutritional status Physical  activity Advanced directives List of other physicians Hospitalizations, surgeries, and ER visits in previous 12 months Vitals Screenings to include cognitive, depression, and falls Referrals and appointments  In addition, I have reviewed and discussed with patient certain preventive protocols, quality metrics, and best practice recommendations. A written personalized care plan for preventive services as well as general preventive health recommendations were provided to patient.    Please review the Medications, Problem list, Past Medical, Surgical Histories, Family Histories, and Social Histories.   Lonna Cobb, CMA   03/28/2023   Nurse Notes: Approximately 30 minute Non-Face -To-Face Medicare Wellness Visit

## 2023-03-28 NOTE — Patient Instructions (Signed)
Health Maintenance, Male Adopting a healthy lifestyle and getting preventive care are important in promoting health and wellness. Ask your health care provider about: The right schedule for you to have regular tests and exams. Things you can do on your own to prevent diseases and keep yourself healthy. What should I know about diet, weight, and exercise? Eat a healthy diet  Eat a diet that includes plenty of vegetables, fruits, low-fat dairy products, and lean protein. Do not eat a lot of foods that are high in solid fats, added sugars, or sodium. Maintain a healthy weight Body mass index (BMI) is a measurement that can be used to identify possible weight problems. It estimates body fat based on height and weight. Your health care provider can help determine your BMI and help you achieve or maintain a healthy weight. Get regular exercise Get regular exercise. This is one of the most important things you can do for your health. Most adults should: Exercise for at least 150 minutes each week. The exercise should increase your heart rate and make you sweat (moderate-intensity exercise). Do strengthening exercises at least twice a week. This is in addition to the moderate-intensity exercise. Spend less time sitting. Even light physical activity can be beneficial. Watch cholesterol and blood lipids Have your blood tested for lipids and cholesterol at 81 years of age, then have this test every 5 years. You may need to have your cholesterol levels checked more often if: Your lipid or cholesterol levels are high. You are older than 81 years of age. You are at high risk for heart disease. What should I know about cancer screening? Many types of cancers can be detected early and may often be prevented. Depending on your health history and family history, you may need to have cancer screening at various ages. This may include screening for: Colorectal cancer. Prostate cancer. Skin cancer. Lung  cancer. What should I know about heart disease, diabetes, and high blood pressure? Blood pressure and heart disease High blood pressure causes heart disease and increases the risk of stroke. This is more likely to develop in people who have high blood pressure readings or are overweight. Talk with your health care provider about your target blood pressure readings. Have your blood pressure checked: Every 3-5 years if you are 18-39 years of age. Every year if you are 40 years old or older. If you are between the ages of 65 and 75 and are a current or former smoker, ask your health care provider if you should have a one-time screening for abdominal aortic aneurysm (AAA). Diabetes Have regular diabetes screenings. This checks your fasting blood sugar level. Have the screening done: Once every three years after age 45 if you are at a normal weight and have a low risk for diabetes. More often and at a younger age if you are overweight or have a high risk for diabetes. What should I know about preventing infection? Hepatitis B If you have a higher risk for hepatitis B, you should be screened for this virus. Talk with your health care provider to find out if you are at risk for hepatitis B infection. Hepatitis C Blood testing is recommended for: Everyone born from 1945 through 1965. Anyone with known risk factors for hepatitis C. Sexually transmitted infections (STIs) You should be screened each year for STIs, including gonorrhea and chlamydia, if: You are sexually active and are younger than 81 years of age. You are older than 81 years of age and your   health care provider tells you that you are at risk for this type of infection. Your sexual activity has changed since you were last screened, and you are at increased risk for chlamydia or gonorrhea. Ask your health care provider if you are at risk. Ask your health care provider about whether you are at high risk for HIV. Your health care provider  may recommend a prescription medicine to help prevent HIV infection. If you choose to take medicine to prevent HIV, you should first get tested for HIV. You should then be tested every 3 months for as long as you are taking the medicine. Follow these instructions at home: Alcohol use Do not drink alcohol if your health care provider tells you not to drink. If you drink alcohol: Limit how much you have to 0-2 drinks a day. Know how much alcohol is in your drink. In the [place] one drink equals one 12 oz bottle of beer (355 mL), one 5 oz glass of wine (148 mL), or one 1 oz glass of hard liquor (44 mL). Lifestyle Do not use any products that contain nicotine or tobacco. These products include cigarettes, chewing tobacco, and vaping devices, such as e-cigarettes. If you need help quitting, ask your health care provider. Do not use street drugs. Do not share needles. Ask your health care provider for help if you need support or information about quitting drugs. General instructions Schedule regular health, dental, and eye exams. Stay current with your vaccines. Tell your health care provider if: You often feel depressed. You have ever been abused or do not feel safe at home. Summary Adopting a healthy lifestyle and getting preventive care are important in promoting health and wellness. Follow your health care provider's instructions about healthy diet, exercising, and getting tested or screened for diseases. Follow your health care provider's instructions on monitoring your cholesterol and blood pressure. This information is not intended to replace advice given to you by your health care provider. Make sure you discuss any questions you have with your health care provider. Document Revised: 04/03/2021 Document Reviewed: 04/03/2021 Elsevier Patient Education  2023 Elsevier Inc.  

## 2023-05-08 ENCOUNTER — Other Ambulatory Visit: Payer: Self-pay | Admitting: Family Medicine

## 2023-05-08 DIAGNOSIS — G3184 Mild cognitive impairment, so stated: Secondary | ICD-10-CM

## 2023-05-27 ENCOUNTER — Other Ambulatory Visit: Payer: Self-pay

## 2023-05-27 DIAGNOSIS — G3184 Mild cognitive impairment, so stated: Secondary | ICD-10-CM

## 2023-05-27 DIAGNOSIS — R41841 Cognitive communication deficit: Secondary | ICD-10-CM | POA: Diagnosis not present

## 2023-05-27 NOTE — Telephone Encounter (Signed)
Patient calls nurse line regarding refill on Donepezil. Called pharmacy. Pharmacist reports that they have a refill of this medication ready for pick up.   Called and LVM with patient advising that prescription is ready at pharmacy.  Veronda Prude, RN

## 2023-05-29 DIAGNOSIS — R41841 Cognitive communication deficit: Secondary | ICD-10-CM | POA: Diagnosis not present

## 2023-05-31 DIAGNOSIS — R41841 Cognitive communication deficit: Secondary | ICD-10-CM | POA: Diagnosis not present

## 2023-06-03 DIAGNOSIS — R41841 Cognitive communication deficit: Secondary | ICD-10-CM | POA: Diagnosis not present

## 2023-06-05 DIAGNOSIS — R41841 Cognitive communication deficit: Secondary | ICD-10-CM | POA: Diagnosis not present

## 2023-06-07 DIAGNOSIS — R41841 Cognitive communication deficit: Secondary | ICD-10-CM | POA: Diagnosis not present

## 2023-06-10 DIAGNOSIS — R41841 Cognitive communication deficit: Secondary | ICD-10-CM | POA: Diagnosis not present

## 2023-06-12 DIAGNOSIS — R41841 Cognitive communication deficit: Secondary | ICD-10-CM | POA: Diagnosis not present

## 2023-07-01 DIAGNOSIS — R41841 Cognitive communication deficit: Secondary | ICD-10-CM | POA: Diagnosis not present

## 2023-07-03 DIAGNOSIS — R41841 Cognitive communication deficit: Secondary | ICD-10-CM | POA: Diagnosis not present

## 2023-07-05 DIAGNOSIS — R41841 Cognitive communication deficit: Secondary | ICD-10-CM | POA: Diagnosis not present

## 2023-07-08 DIAGNOSIS — R41841 Cognitive communication deficit: Secondary | ICD-10-CM | POA: Diagnosis not present

## 2023-07-10 DIAGNOSIS — R41841 Cognitive communication deficit: Secondary | ICD-10-CM | POA: Diagnosis not present

## 2023-07-12 DIAGNOSIS — R41841 Cognitive communication deficit: Secondary | ICD-10-CM | POA: Diagnosis not present

## 2023-07-15 DIAGNOSIS — R41841 Cognitive communication deficit: Secondary | ICD-10-CM | POA: Diagnosis not present

## 2023-07-17 DIAGNOSIS — R41841 Cognitive communication deficit: Secondary | ICD-10-CM | POA: Diagnosis not present

## 2023-07-19 DIAGNOSIS — R41841 Cognitive communication deficit: Secondary | ICD-10-CM | POA: Diagnosis not present

## 2023-07-22 DIAGNOSIS — R41841 Cognitive communication deficit: Secondary | ICD-10-CM | POA: Diagnosis not present

## 2023-07-24 DIAGNOSIS — R41841 Cognitive communication deficit: Secondary | ICD-10-CM | POA: Diagnosis not present

## 2023-07-25 DIAGNOSIS — R41841 Cognitive communication deficit: Secondary | ICD-10-CM | POA: Diagnosis not present

## 2023-07-26 DIAGNOSIS — R41841 Cognitive communication deficit: Secondary | ICD-10-CM | POA: Diagnosis not present

## 2023-07-31 DIAGNOSIS — R41841 Cognitive communication deficit: Secondary | ICD-10-CM | POA: Diagnosis not present

## 2023-08-02 DIAGNOSIS — R41841 Cognitive communication deficit: Secondary | ICD-10-CM | POA: Diagnosis not present

## 2023-08-05 DIAGNOSIS — R41841 Cognitive communication deficit: Secondary | ICD-10-CM | POA: Diagnosis not present

## 2023-08-07 ENCOUNTER — Telehealth: Payer: Self-pay

## 2023-08-07 DIAGNOSIS — R41841 Cognitive communication deficit: Secondary | ICD-10-CM | POA: Diagnosis not present

## 2023-08-07 NOTE — Telephone Encounter (Signed)
Patient LVM on nurse line in regards to PCP apt on 9/13.  The VM cut off before I could gather what the call was referring to.   I attempted to call him back x2. However, his VM states to continue to try to reach him vs leaving a message.   If patients calls back please let me know so I can speak with him.

## 2023-08-09 ENCOUNTER — Encounter: Payer: Self-pay | Admitting: Family Medicine

## 2023-08-09 ENCOUNTER — Ambulatory Visit: Payer: Medicare PPO | Admitting: Family Medicine

## 2023-08-09 VITALS — BP 139/78 | HR 59 | Temp 98.4°F | Ht 73.0 in | Wt 162.2 lb

## 2023-08-09 DIAGNOSIS — Z23 Encounter for immunization: Secondary | ICD-10-CM

## 2023-08-09 DIAGNOSIS — R310 Gross hematuria: Secondary | ICD-10-CM | POA: Diagnosis not present

## 2023-08-09 DIAGNOSIS — R41841 Cognitive communication deficit: Secondary | ICD-10-CM | POA: Diagnosis not present

## 2023-08-09 DIAGNOSIS — G3184 Mild cognitive impairment, so stated: Secondary | ICD-10-CM

## 2023-08-09 DIAGNOSIS — C439 Malignant melanoma of skin, unspecified: Secondary | ICD-10-CM

## 2023-08-09 DIAGNOSIS — R001 Bradycardia, unspecified: Secondary | ICD-10-CM | POA: Diagnosis not present

## 2023-08-09 DIAGNOSIS — R3129 Other microscopic hematuria: Secondary | ICD-10-CM

## 2023-08-09 LAB — POCT UA - MICROSCOPIC ONLY
Bacteria, U Microscopic: NONE SEEN
WBC, Ur, HPF, POC: NONE SEEN (ref 0–5)

## 2023-08-09 LAB — POCT URINALYSIS DIP (MANUAL ENTRY)
Bilirubin, UA: NEGATIVE
Blood, UA: NEGATIVE
Glucose, UA: NEGATIVE mg/dL
Ketones, POC UA: NEGATIVE mg/dL
Leukocytes, UA: NEGATIVE
Nitrite, UA: NEGATIVE
Protein Ur, POC: NEGATIVE mg/dL
Spec Grav, UA: 1.02 (ref 1.010–1.025)
Urobilinogen, UA: 1 U/dL
pH, UA: 7.5 (ref 5.0–8.0)

## 2023-08-09 MED ORDER — DONEPEZIL HCL 10 MG PO TABS: 10.0000 mg | ORAL_TABLET | Freq: Every day | ORAL | 3 refills | Status: DC

## 2023-08-09 NOTE — Assessment & Plan Note (Signed)
Continue to follow with Derm annually.

## 2023-08-09 NOTE — Assessment & Plan Note (Signed)
Tolerant of aricept, continue. Has appropriate support system and advanced directives.

## 2023-08-09 NOTE — Progress Notes (Signed)
Pt here to meet new PCP. As well as get a flu shot.  Patient states ongoign concern with short term memory.   Patient is wondering which medication he should be taking.

## 2023-08-09 NOTE — Patient Instructions (Signed)
It was great to see you!  Our plans for today:  - Talk with your daughters about whether or not you want to undergo another colonoscopy and let us know what you decide. - We sent refills to your pharmacy.  - We are rechecking your urine today, we will send you a letter with these results.  - Come back next year, sooner if you need [place].  Take care and seek immediate care sooner if you develop any concerns.   Dr. Linwood Dibbles

## 2023-08-09 NOTE — Assessment & Plan Note (Signed)
Repeat UA with micro

## 2023-08-09 NOTE — Progress Notes (Signed)
   SUBJECTIVE:   CHIEF COMPLAINT / HPI:   H/o melanoma - s/p excision 2014. Follows with Dermatology.  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 Previously seen at Geriatric specialty clinic at [facility]. Not noticing obvious worsening in memory. Does not drive. Previosly taught economics. Has 2 daughters, one is a Warden/ranger in [place], one an OB/GYN in [place]. Daughters manage his finances. Prefers daughters to make decisions if can't make for himself. Has DNR and advanced directives, scanned into chart. Manages own medications. Has night light and avoids getting up too much at night. Does not cook. Plays pickleball and aquatic therapy few times per week. Lives in [place], [facility] [place].   Has dentist, eye doctor. Has dentures, fit well. No loose rugs or cords in home. Has hearing aids.  HM - due for flu vaccine. Colonoscopy due next month. H/o polyps.   OBJECTIVE:   BP 139/78   Pulse (!) 59   Temp 98.4 F (36.9 C) (Oral)   Ht 6\' 1"  (1.854 m)   Wt 162 lb 3.2 oz (73.6 kg)   SpO2 99%   BMI 21.40 kg/m   Gen: well appearing, in NAD HEENT: No cervical or supraclavicular lymphadenopathy. No thyromegaly.  Card: RRR Lungs: CTAB Abd: soft, NTND, +BS Ext: WWP, no edema   ASSESSMENT/PLAN:   Melanoma of skin (HCC) Continue to follow with Derm annually.   Hematuria, gross Repeat UA with micro  Mild cognitive impairment Tolerant of aricept, continue. Has appropriate support system and advanced directives.    Health maintenance - discussed colonoscopy, benefits vs risks. Advised to discuss with daughters and let us know if he plans to proceed and we will place referral if needed.  - flu shot given today  Caro Laroche, DO

## 2023-08-12 DIAGNOSIS — R41841 Cognitive communication deficit: Secondary | ICD-10-CM | POA: Diagnosis not present

## 2023-08-14 DIAGNOSIS — R41841 Cognitive communication deficit: Secondary | ICD-10-CM | POA: Diagnosis not present

## 2023-08-16 DIAGNOSIS — R41841 Cognitive communication deficit: Secondary | ICD-10-CM | POA: Diagnosis not present

## 2023-08-19 DIAGNOSIS — R41841 Cognitive communication deficit: Secondary | ICD-10-CM | POA: Diagnosis not present

## 2023-08-21 DIAGNOSIS — R41841 Cognitive communication deficit: Secondary | ICD-10-CM | POA: Diagnosis not present

## 2023-08-23 DIAGNOSIS — R41841 Cognitive communication deficit: Secondary | ICD-10-CM | POA: Diagnosis not present

## 2023-08-26 ENCOUNTER — Other Ambulatory Visit: Payer: Self-pay

## 2023-08-26 DIAGNOSIS — G3184 Mild cognitive impairment, so stated: Secondary | ICD-10-CM

## 2023-08-26 DIAGNOSIS — R41841 Cognitive communication deficit: Secondary | ICD-10-CM | POA: Diagnosis not present

## 2023-08-26 MED ORDER — DONEPEZIL HCL 10 MG PO TABS
10.0000 mg | ORAL_TABLET | Freq: Every day | ORAL | 3 refills | Status: DC
Start: 2023-08-26 — End: 2024-06-12

## 2023-08-28 DIAGNOSIS — R41841 Cognitive communication deficit: Secondary | ICD-10-CM | POA: Diagnosis not present

## 2023-08-30 DIAGNOSIS — R41841 Cognitive communication deficit: Secondary | ICD-10-CM | POA: Diagnosis not present

## 2023-09-02 DIAGNOSIS — R41841 Cognitive communication deficit: Secondary | ICD-10-CM | POA: Diagnosis not present

## 2023-09-04 DIAGNOSIS — R41841 Cognitive communication deficit: Secondary | ICD-10-CM | POA: Diagnosis not present

## 2023-09-06 DIAGNOSIS — R41841 Cognitive communication deficit: Secondary | ICD-10-CM | POA: Diagnosis not present

## 2023-09-09 DIAGNOSIS — R41841 Cognitive communication deficit: Secondary | ICD-10-CM | POA: Diagnosis not present

## 2023-09-11 DIAGNOSIS — R41841 Cognitive communication deficit: Secondary | ICD-10-CM | POA: Diagnosis not present

## 2023-09-13 DIAGNOSIS — R41841 Cognitive communication deficit: Secondary | ICD-10-CM | POA: Diagnosis not present

## 2023-09-16 DIAGNOSIS — R41841 Cognitive communication deficit: Secondary | ICD-10-CM | POA: Diagnosis not present

## 2023-09-18 DIAGNOSIS — R41841 Cognitive communication deficit: Secondary | ICD-10-CM | POA: Diagnosis not present

## 2023-09-23 DIAGNOSIS — R41841 Cognitive communication deficit: Secondary | ICD-10-CM | POA: Diagnosis not present

## 2023-09-25 DIAGNOSIS — R41841 Cognitive communication deficit: Secondary | ICD-10-CM | POA: Diagnosis not present

## 2023-09-27 DIAGNOSIS — R41841 Cognitive communication deficit: Secondary | ICD-10-CM | POA: Diagnosis not present

## 2023-09-30 DIAGNOSIS — R41841 Cognitive communication deficit: Secondary | ICD-10-CM | POA: Diagnosis not present

## 2023-10-02 DIAGNOSIS — R41841 Cognitive communication deficit: Secondary | ICD-10-CM | POA: Diagnosis not present

## 2023-10-04 DIAGNOSIS — R41841 Cognitive communication deficit: Secondary | ICD-10-CM | POA: Diagnosis not present

## 2023-10-07 DIAGNOSIS — R41841 Cognitive communication deficit: Secondary | ICD-10-CM | POA: Diagnosis not present

## 2023-10-09 DIAGNOSIS — R41841 Cognitive communication deficit: Secondary | ICD-10-CM | POA: Diagnosis not present

## 2023-10-11 DIAGNOSIS — R41841 Cognitive communication deficit: Secondary | ICD-10-CM | POA: Diagnosis not present

## 2023-10-14 DIAGNOSIS — R41841 Cognitive communication deficit: Secondary | ICD-10-CM | POA: Diagnosis not present

## 2023-10-16 DIAGNOSIS — R41841 Cognitive communication deficit: Secondary | ICD-10-CM | POA: Diagnosis not present

## 2023-10-18 DIAGNOSIS — R41841 Cognitive communication deficit: Secondary | ICD-10-CM | POA: Diagnosis not present

## 2023-10-29 DIAGNOSIS — R41841 Cognitive communication deficit: Secondary | ICD-10-CM | POA: Diagnosis not present

## 2023-10-30 DIAGNOSIS — R41841 Cognitive communication deficit: Secondary | ICD-10-CM | POA: Diagnosis not present

## 2023-11-04 DIAGNOSIS — R41841 Cognitive communication deficit: Secondary | ICD-10-CM | POA: Diagnosis not present

## 2023-11-06 DIAGNOSIS — L821 Other seborrheic keratosis: Secondary | ICD-10-CM | POA: Diagnosis not present

## 2023-11-06 DIAGNOSIS — L905 Scar conditions and fibrosis of skin: Secondary | ICD-10-CM | POA: Diagnosis not present

## 2023-11-06 DIAGNOSIS — Z85820 Personal history of malignant melanoma of skin: Secondary | ICD-10-CM | POA: Diagnosis not present

## 2023-11-06 DIAGNOSIS — Z85828 Personal history of other malignant neoplasm of skin: Secondary | ICD-10-CM | POA: Diagnosis not present

## 2023-11-06 DIAGNOSIS — R41841 Cognitive communication deficit: Secondary | ICD-10-CM | POA: Diagnosis not present

## 2023-11-06 DIAGNOSIS — L218 Other seborrheic dermatitis: Secondary | ICD-10-CM | POA: Diagnosis not present

## 2023-11-06 DIAGNOSIS — L570 Actinic keratosis: Secondary | ICD-10-CM | POA: Diagnosis not present

## 2023-11-08 DIAGNOSIS — R41841 Cognitive communication deficit: Secondary | ICD-10-CM | POA: Diagnosis not present

## 2023-11-11 DIAGNOSIS — R41841 Cognitive communication deficit: Secondary | ICD-10-CM | POA: Diagnosis not present

## 2023-11-13 DIAGNOSIS — R41841 Cognitive communication deficit: Secondary | ICD-10-CM | POA: Diagnosis not present

## 2023-11-15 DIAGNOSIS — R41841 Cognitive communication deficit: Secondary | ICD-10-CM | POA: Diagnosis not present

## 2023-11-27 DIAGNOSIS — R41841 Cognitive communication deficit: Secondary | ICD-10-CM | POA: Diagnosis not present

## 2023-11-28 ENCOUNTER — Other Ambulatory Visit: Payer: Self-pay | Admitting: Family Medicine

## 2023-11-28 DIAGNOSIS — J310 Chronic rhinitis: Secondary | ICD-10-CM

## 2023-11-29 DIAGNOSIS — R41841 Cognitive communication deficit: Secondary | ICD-10-CM | POA: Diagnosis not present

## 2023-12-02 DIAGNOSIS — R41841 Cognitive communication deficit: Secondary | ICD-10-CM | POA: Diagnosis not present

## 2023-12-04 DIAGNOSIS — R41841 Cognitive communication deficit: Secondary | ICD-10-CM | POA: Diagnosis not present

## 2023-12-09 DIAGNOSIS — R41841 Cognitive communication deficit: Secondary | ICD-10-CM | POA: Diagnosis not present

## 2023-12-11 DIAGNOSIS — R41841 Cognitive communication deficit: Secondary | ICD-10-CM | POA: Diagnosis not present

## 2023-12-13 DIAGNOSIS — R41841 Cognitive communication deficit: Secondary | ICD-10-CM | POA: Diagnosis not present

## 2023-12-16 DIAGNOSIS — R41841 Cognitive communication deficit: Secondary | ICD-10-CM | POA: Diagnosis not present

## 2023-12-18 DIAGNOSIS — R41841 Cognitive communication deficit: Secondary | ICD-10-CM | POA: Diagnosis not present

## 2023-12-23 DIAGNOSIS — R41841 Cognitive communication deficit: Secondary | ICD-10-CM | POA: Diagnosis not present

## 2023-12-25 DIAGNOSIS — R41841 Cognitive communication deficit: Secondary | ICD-10-CM | POA: Diagnosis not present

## 2023-12-25 DIAGNOSIS — H524 Presbyopia: Secondary | ICD-10-CM | POA: Diagnosis not present

## 2023-12-25 DIAGNOSIS — H5203 Hypermetropia, bilateral: Secondary | ICD-10-CM | POA: Diagnosis not present

## 2023-12-25 DIAGNOSIS — H43393 Other vitreous opacities, bilateral: Secondary | ICD-10-CM | POA: Diagnosis not present

## 2023-12-25 DIAGNOSIS — H53143 Visual discomfort, bilateral: Secondary | ICD-10-CM | POA: Diagnosis not present

## 2023-12-25 DIAGNOSIS — H52223 Regular astigmatism, bilateral: Secondary | ICD-10-CM | POA: Diagnosis not present

## 2023-12-25 DIAGNOSIS — Z9849 Cataract extraction status, unspecified eye: Secondary | ICD-10-CM | POA: Diagnosis not present

## 2023-12-25 DIAGNOSIS — Z961 Presence of intraocular lens: Secondary | ICD-10-CM | POA: Diagnosis not present

## 2023-12-25 DIAGNOSIS — H35373 Puckering of macula, bilateral: Secondary | ICD-10-CM | POA: Diagnosis not present

## 2023-12-27 DIAGNOSIS — R41841 Cognitive communication deficit: Secondary | ICD-10-CM | POA: Diagnosis not present

## 2023-12-30 DIAGNOSIS — R41841 Cognitive communication deficit: Secondary | ICD-10-CM | POA: Diagnosis not present

## 2024-01-01 DIAGNOSIS — R41841 Cognitive communication deficit: Secondary | ICD-10-CM | POA: Diagnosis not present

## 2024-01-03 DIAGNOSIS — R41841 Cognitive communication deficit: Secondary | ICD-10-CM | POA: Diagnosis not present

## 2024-01-06 DIAGNOSIS — R41841 Cognitive communication deficit: Secondary | ICD-10-CM | POA: Diagnosis not present

## 2024-01-08 DIAGNOSIS — R41841 Cognitive communication deficit: Secondary | ICD-10-CM | POA: Diagnosis not present

## 2024-01-10 DIAGNOSIS — R41841 Cognitive communication deficit: Secondary | ICD-10-CM | POA: Diagnosis not present

## 2024-01-13 DIAGNOSIS — R41841 Cognitive communication deficit: Secondary | ICD-10-CM | POA: Diagnosis not present

## 2024-01-15 DIAGNOSIS — R41841 Cognitive communication deficit: Secondary | ICD-10-CM | POA: Diagnosis not present

## 2024-01-17 DIAGNOSIS — R41841 Cognitive communication deficit: Secondary | ICD-10-CM | POA: Diagnosis not present

## 2024-01-20 DIAGNOSIS — R41841 Cognitive communication deficit: Secondary | ICD-10-CM | POA: Diagnosis not present

## 2024-01-22 DIAGNOSIS — R41841 Cognitive communication deficit: Secondary | ICD-10-CM | POA: Diagnosis not present

## 2024-01-24 DIAGNOSIS — R41841 Cognitive communication deficit: Secondary | ICD-10-CM | POA: Diagnosis not present

## 2024-01-27 DIAGNOSIS — R41841 Cognitive communication deficit: Secondary | ICD-10-CM | POA: Diagnosis not present

## 2024-01-29 DIAGNOSIS — R41841 Cognitive communication deficit: Secondary | ICD-10-CM | POA: Diagnosis not present

## 2024-01-31 DIAGNOSIS — R41841 Cognitive communication deficit: Secondary | ICD-10-CM | POA: Diagnosis not present

## 2024-02-03 DIAGNOSIS — R41841 Cognitive communication deficit: Secondary | ICD-10-CM | POA: Diagnosis not present

## 2024-02-05 DIAGNOSIS — R41841 Cognitive communication deficit: Secondary | ICD-10-CM | POA: Diagnosis not present

## 2024-02-07 DIAGNOSIS — R41841 Cognitive communication deficit: Secondary | ICD-10-CM | POA: Diagnosis not present

## 2024-02-10 DIAGNOSIS — R41841 Cognitive communication deficit: Secondary | ICD-10-CM | POA: Diagnosis not present

## 2024-02-12 DIAGNOSIS — R41841 Cognitive communication deficit: Secondary | ICD-10-CM | POA: Diagnosis not present

## 2024-02-14 DIAGNOSIS — R41841 Cognitive communication deficit: Secondary | ICD-10-CM | POA: Diagnosis not present

## 2024-02-17 DIAGNOSIS — R41841 Cognitive communication deficit: Secondary | ICD-10-CM | POA: Diagnosis not present

## 2024-02-19 DIAGNOSIS — R41841 Cognitive communication deficit: Secondary | ICD-10-CM | POA: Diagnosis not present

## 2024-02-21 DIAGNOSIS — R41841 Cognitive communication deficit: Secondary | ICD-10-CM | POA: Diagnosis not present

## 2024-02-24 DIAGNOSIS — R41841 Cognitive communication deficit: Secondary | ICD-10-CM | POA: Diagnosis not present

## 2024-02-26 DIAGNOSIS — R41841 Cognitive communication deficit: Secondary | ICD-10-CM | POA: Diagnosis not present

## 2024-02-27 DIAGNOSIS — R278 Other lack of coordination: Secondary | ICD-10-CM | POA: Diagnosis not present

## 2024-02-27 DIAGNOSIS — R41841 Cognitive communication deficit: Secondary | ICD-10-CM | POA: Diagnosis not present

## 2024-02-27 DIAGNOSIS — M25519 Pain in unspecified shoulder: Secondary | ICD-10-CM | POA: Diagnosis not present

## 2024-02-28 DIAGNOSIS — R41841 Cognitive communication deficit: Secondary | ICD-10-CM | POA: Diagnosis not present

## 2024-03-02 DIAGNOSIS — R41841 Cognitive communication deficit: Secondary | ICD-10-CM | POA: Diagnosis not present

## 2024-03-03 DIAGNOSIS — R41841 Cognitive communication deficit: Secondary | ICD-10-CM | POA: Diagnosis not present

## 2024-03-03 DIAGNOSIS — M25519 Pain in unspecified shoulder: Secondary | ICD-10-CM | POA: Diagnosis not present

## 2024-03-03 DIAGNOSIS — R278 Other lack of coordination: Secondary | ICD-10-CM | POA: Diagnosis not present

## 2024-03-04 DIAGNOSIS — R41841 Cognitive communication deficit: Secondary | ICD-10-CM | POA: Diagnosis not present

## 2024-03-06 DIAGNOSIS — R41841 Cognitive communication deficit: Secondary | ICD-10-CM | POA: Diagnosis not present

## 2024-03-09 DIAGNOSIS — R41841 Cognitive communication deficit: Secondary | ICD-10-CM | POA: Diagnosis not present

## 2024-03-10 DIAGNOSIS — M25519 Pain in unspecified shoulder: Secondary | ICD-10-CM | POA: Diagnosis not present

## 2024-03-10 DIAGNOSIS — R278 Other lack of coordination: Secondary | ICD-10-CM | POA: Diagnosis not present

## 2024-03-10 DIAGNOSIS — R41841 Cognitive communication deficit: Secondary | ICD-10-CM | POA: Diagnosis not present

## 2024-03-17 DIAGNOSIS — R278 Other lack of coordination: Secondary | ICD-10-CM | POA: Diagnosis not present

## 2024-03-17 DIAGNOSIS — R41841 Cognitive communication deficit: Secondary | ICD-10-CM | POA: Diagnosis not present

## 2024-03-17 DIAGNOSIS — M25519 Pain in unspecified shoulder: Secondary | ICD-10-CM | POA: Diagnosis not present

## 2024-03-18 DIAGNOSIS — R41841 Cognitive communication deficit: Secondary | ICD-10-CM | POA: Diagnosis not present

## 2024-03-20 DIAGNOSIS — R41841 Cognitive communication deficit: Secondary | ICD-10-CM | POA: Diagnosis not present

## 2024-03-23 DIAGNOSIS — R41841 Cognitive communication deficit: Secondary | ICD-10-CM | POA: Diagnosis not present

## 2024-03-24 DIAGNOSIS — R41841 Cognitive communication deficit: Secondary | ICD-10-CM | POA: Diagnosis not present

## 2024-03-24 DIAGNOSIS — M25519 Pain in unspecified shoulder: Secondary | ICD-10-CM | POA: Diagnosis not present

## 2024-03-24 DIAGNOSIS — R278 Other lack of coordination: Secondary | ICD-10-CM | POA: Diagnosis not present

## 2024-03-25 DIAGNOSIS — R41841 Cognitive communication deficit: Secondary | ICD-10-CM | POA: Diagnosis not present

## 2024-03-27 DIAGNOSIS — R41841 Cognitive communication deficit: Secondary | ICD-10-CM | POA: Diagnosis not present

## 2024-03-30 DIAGNOSIS — R41841 Cognitive communication deficit: Secondary | ICD-10-CM | POA: Diagnosis not present

## 2024-03-31 DIAGNOSIS — R278 Other lack of coordination: Secondary | ICD-10-CM | POA: Diagnosis not present

## 2024-03-31 DIAGNOSIS — M25519 Pain in unspecified shoulder: Secondary | ICD-10-CM | POA: Diagnosis not present

## 2024-03-31 DIAGNOSIS — R41841 Cognitive communication deficit: Secondary | ICD-10-CM | POA: Diagnosis not present

## 2024-04-01 DIAGNOSIS — R41841 Cognitive communication deficit: Secondary | ICD-10-CM | POA: Diagnosis not present

## 2024-04-03 DIAGNOSIS — R41841 Cognitive communication deficit: Secondary | ICD-10-CM | POA: Diagnosis not present

## 2024-04-06 ENCOUNTER — Ambulatory Visit (INDEPENDENT_AMBULATORY_CARE_PROVIDER_SITE_OTHER)

## 2024-04-06 VITALS — Wt 172.2 lb

## 2024-04-06 DIAGNOSIS — Z0000 Encounter for general adult medical examination without abnormal findings: Secondary | ICD-10-CM | POA: Diagnosis not present

## 2024-04-06 DIAGNOSIS — R41841 Cognitive communication deficit: Secondary | ICD-10-CM | POA: Diagnosis not present

## 2024-04-06 NOTE — Addendum Note (Signed)
 Addended by: Margette Sheldon on: 04/06/2024 03:57 PM   Modules accepted: Level of Service

## 2024-04-06 NOTE — Progress Notes (Cosign Needed Addendum)
 Subjective:   Stephen [name] is a 83 y.o. who presents for a Medicare Wellness preventive visit.  As a reminder, Annual Wellness Visits don't include a physical exam, and some assessments may be limited, especially if this visit is performed virtually. We may recommend an in-person visit if needed.  Visit Complete: Virtual I connected with  Stephen [name] on 04/06/24 in the office.  This nurse verified that I am speaking with the correct person using two identifiers.  Patient Location: Other:  Office/Clinic-In person  Provider Location: Office/Clinic  I discussed the limitations of evaluation and management by telemedicine. The patient expressed understanding and agreed to proceed.  Vital Signs: No vital signs checked during this visit, except for weight and height.   VideoDeclined- This patient declined Librarian, academic. Therefore the visit was completed with audio only.  Persons Participating in Visit: Patient.  AWV Questionnaire: Yes: Patient Medicare AWV questionnaire was completed by the patient on 04/01/2024; I have confirmed that all information answered by patient is correct and no changes since this date.  Cardiac Risk Factors include: advanced age (>11men, >51 women);family history of premature cardiovascular disease;male gender     Objective:     Today's Vitals   04/06/24 1158  Weight: 172 lb 3.2 oz (78.1 kg)  PainSc: 0-No pain   Body mass index is 22.72 kg/m.     04/06/2024   12:03 PM 03/28/2023    3:40 PM 10/11/2022    1:59 PM 11/02/2021    9:09 AM 05/11/2021   10:44 AM 10/21/2019   11:36 AM 08/26/2018    3:41 PM  Advanced Directives  Does Patient Have a Medical Advance Directive? Yes Yes Yes No No Yes No  Type of Estate agent of [place];Living will Healthcare Power of [place];Living will Living will   Living will;Healthcare Power of Attorney   Does patient want to make changes to medical advance  directive? No - Patient declined No - Patient declined No - Patient declined   No - Patient declined   Copy of Healthcare Power of Attorney in Chart? Yes - validated most recent copy scanned in chart (See row information)     Yes - validated most recent copy scanned in chart (See row information)   Would patient like information on creating a medical advance directive?    No - Patient declined No - Patient declined  No - Patient declined    Current Medications (verified) Outpatient Encounter Medications as of 04/06/2024  Medication Sig   donepezil  (ARICEPT ) 10 MG tablet Take 1 tablet (10 mg total) by mouth at bedtime.   acetaminophen  (TYLENOL ) 500 MG tablet Take 500 mg by mouth every 6 (six) hours as needed. (Patient not taking: Reported on 04/06/2024)   Azelastine  HCl 137 MCG/SPRAY SOLN PLACE 2 SPRAYS INTO BOTH NOSTRILS TWO TIMES A DAY (Patient not taking: Reported on 04/06/2024)   Calcium Carb-Cholecalciferol (CALCIUM-VITAMIN D) 500-200 MG-UNIT tablet Take 1 tablet by mouth daily. (Patient not taking: Reported on 03/28/2023)   hydrocortisone valerate cream (WESTCORT) 0.2 % Apply 1 application topically daily as needed (dry skin).  (Patient not taking: Reported on 10/11/2022)   No facility-administered encounter medications on file as of 04/06/2024.    Allergies (verified) Naproxen   History: Past Medical History:  Diagnosis Date   Medical history non-contributory    Melanoma in situ of back (HCC) 2015   Past Surgical History:  Procedure Laterality Date   AXILLARY SENTINEL NODE BIOPSY Right 03/05/2013  Procedure: AXILLARY SENTINEL NODE BIOPSY;  Surgeon: Enid Harry, MD;  Location: [facility] OR;  Service: General;  Laterality: Right;   basal /sq cell removal  2014   x-4 times removal   EXCISION MELANOMA WITH SENTINEL LYMPH NODE BIOPSY Right 03/05/2013   Procedure: WIDE LOCAL EXCISION RIGHT UPPER BACK  MELANOMA WITH RIGHT AXILLARYSENTINEL NODE BIOPSY;  Surgeon: Enid Harry, MD;   Location: [facility] OR;  Service: General;  Laterality: Right;   Family History  Problem Relation Age of Onset   Heart disease Mother    Diabetes Mother        died age    Arthritis Mother    Glaucoma Father    Heart disease Father    Arthritis Father    Stroke Father    Cancer Sister        Lung CA   Arthritis Brother    Social History   Socioeconomic History   Marital status: Widowed    Spouse name: Stephen [name]   Number of children: 2   Years of education: 20+   Highest education level: Doctorate  Occupational History   Occupation: Administrator, sports    Comment: [employer]  Tobacco Use   Smoking status: Never   Smokeless tobacco: Never  Vaping Use   Vaping status: Never Used  Substance and Sexual Activity   Alcohol use: Not Currently    Comment: daily   Drug use: No   Sexual activity: Yes  Other Topics Concern   Not on file  Social History Narrative   Health Care POA:    Emergency Contact: spouse, Stephen [name] [phone] being treated for Multiple Myeloma   End of Life Plan:        Diet: Patient has a varied diet of protein, vegetable, starch.  Limited red meat   Exercise: Patient exercises a minimum of 3 times a week for 2 hours.   Seatbelts: Patient reports wearing seatbelt when in vehicle.    Paulene Boron Exposure/Protection: Patient reports wearing sunscreen daily.   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 Currently works for [employer] and ramps  for patients 05/28/2017   Religious / Personal Beliefs: "No formal religion" 05/28/2017   Interests / Fun: Bike, read, travel 05/28/2017   Other: Very involved with anti-racial org in [place] and [employer] [place] 05/28/2017   L. Corinna Dickens, RN, BSN                                                                                                          Social Drivers  of Health   Financial Resource Strain: Low Risk  (04/06/2024)   Overall Financial Resource Strain (CARDIA)    Difficulty of Paying Living Expenses: Not hard at all  Food Insecurity: No Food Insecurity (04/06/2024)   Hunger Vital Sign    Worried About Running Out of Food in the Last Year: Never true    Ran Out of Food in the Last Year: Never true  Transportation Needs: No Transportation Needs (04/06/2024)   PRAPARE - Administrator, Civil Service (Medical): No    Lack of Transportation (Non-Medical): No  Physical Activity: Sufficiently Active (04/06/2024)   Exercise Vital Sign    Days of Exercise per Week: 6 days    Minutes of Exercise per Session: 40 min  Stress: No Stress Concern Present (04/06/2024)   [employer] of Occupational Health - Occupational Stress Questionnaire    Feeling of Stress : Only a little  Social Connections: Moderately Integrated (04/06/2024)   Social Connection and Isolation Panel [NHANES]    Frequency of Communication with Friends and Family: More than three times a week    Frequency of Social Gatherings with Friends and Family: More than three times a week    Attends Religious Services: 1 to 4 times per year    Active Member of [employer] or Organizations: No    Attends Banker Meetings: 1 to 4 times per year    Marital Status: Widowed  Recent Concern: Social Connections - Moderately Isolated (04/01/2024)   Social Connection and Isolation Panel [NHANES]    Frequency of Communication with Friends and Family: More than three times a week    Frequency of Social Gatherings with Friends and  Family: More than three times a week    Attends Religious Services: 1 to 4 times per year    Active Member of [employer] or Organizations: No    Attends Banker Meetings: Not on file    Marital Status: Widowed    Tobacco Counseling Counseling given: Not Answered    Clinical Intake:  Pre-visit preparation completed: Yes  Pain : No/denies pain Pain Score: 0-No pain     BMI - recorded: 22.72 Nutritional Status: BMI of 19-24  Normal Nutritional Risks: None Diabetes: No  No results found for: "HGBA1C"   How often do you need to have someone help you when you read instructions, pamphlets, or other written materials from your doctor or pharmacy?: 1 - Never What is the last grade level you completed in school?: COLLEGE GRADUATE; RETIRED PROFESSOR FROM [employer]  Interpreter Needed?: No  Information entered by :: Randle Shatzer N. Yordin Rhoda, LPN.   Activities of Daily Living     04/06/2024   12:06 PM 04/01/2024    3:45 PM  In your present state of health, do you have any difficulty performing the following activities:  Hearing? 0 0  Vision? 0 0  Difficulty concentrating or making decisions? 1 1  Walking or climbing stairs? 0 0  Dressing or bathing? 0 0  Doing errands, shopping? 0 0  Preparing Food and eating ? N N  Using the Toilet? N N  In the past six months, have you accidently leaked urine? N N  Do you have problems with loss of bowel control? N N  Managing your Medications? N   Managing your Finances? N N  Housekeeping or managing your Housekeeping? N N    Patient Care Team: Kandis Ormond, DO as PCP - General (Family Medicine) Christoper Crafts, MD (Dermatology)  Mottinger, Moira Andrews (Dentistry) Ozell Blunt, MD as Attending Physician (Gastroenterology) Princella Brooklyn, OD as Consulting Physician (Optometry)  Indicate any recent Medical Services you may have received from other than [facility] providers in the past year (date may be approximate).     Assessment:     This is a routine wellness examination for [employer].  Hearing/Vision screen Hearing Screening - Comments:: Some hearing difficulties. No hearing aids.  Vision Screening - Comments:: Wears reading glasses - up to date with routine eye exams with Lyle San, OD.    Goals Addressed             This Visit's Progress    Client understands the importance of follow-up with providers by attending scheduled visits.         Depression Screen     04/06/2024   12:07 PM 08/09/2023   10:54 AM 03/28/2023    3:32 PM 10/11/2022    1:57 PM 01/01/2022    9:20 AM 11/02/2021    9:09 AM 05/11/2021   10:51 AM  PHQ 2/9 Scores  PHQ - 2 Score 0 0 0 2 2 1 2   PHQ- 9 Score 3 1  3 4 1 4     Fall Risk     04/01/2024    3:45 PM 08/09/2023   10:54 AM 03/28/2023    3:25 PM 10/11/2022    1:57 PM 05/11/2021   10:44 AM  Fall Risk   Falls in the past year? 1 0 0 0 0  Number falls in past yr: 0 0 0 0   Injury with Fall? 0 0 0 0   Risk for fall due to : Mental status change No Fall Risks No Fall Risks  No Fall Risks  Follow up Falls evaluation completed;Education provided  Falls evaluation completed Falls evaluation completed Falls prevention discussed    MEDICARE RISK AT HOME:  Medicare Risk at Home Any stairs in or around the home?: Yes (elevator; uses the stairs for exercising) If so, are there any without handrails?: No Home free of loose throw rugs in walkways, pet beds, electrical cords, etc?: Yes Adequate lighting in your home to reduce risk of falls?: Yes Life alert?: Yes Use of a cane, walker or w/c?: No Grab bars in the bathroom?: No (in the process of remodeling) Shower chair or bench in shower?: No Elevated toilet seat or a handicapped toilet?: No  TIMED UP AND GO:  Was the test performed?  Yes TUG: No assistive devices, score 8 seconds.  Cognitive Function: Impaired: Patient has current diagnosis of cognitive impairment.    04/06/2024   12:07 PM 03/06/2012    9:00 AM 05/09/2011    4:00  PM  MMSE - Mini Mental State Exam  Not completed: Unable to complete    Orientation to time  5 5  Orientation to Place  5 5  Registration  3 3  Attention/ Calculation  5 5  Recall  3 3  Language- name 2 objects  2 2  Language- repeat  1 1  Language- follow 3 step command  3 3  Language- read & follow direction  1 1  Write a sentence  1 1  Copy design  1 1  Total score  30 30        03/28/2023    3:37 PM 10/20/2019   11:28 AM  6CIT Screen  What Year? 4 points 0 points  What month? 0 points 0 points  What time? 0 points 0 points  Count back from 20 0 points 0 points  Months in reverse 0 points 0 points  Repeat phrase 10 points 0 points  Total Score 14 points 0 points    Immunizations Immunization History  Administered Date(s) Administered   Fluad Quad(high Dose 65+) 07/22/2019   Influenza Whole 10/14/2009   Influenza, High Dose Seasonal PF 08/27/2016, 08/30/2017   Influenza, Seasonal, Injecte, Preservative Fre 08/09/2023   Influenza,inj,Quad PF,6+ Mos 09/23/2013, 08/26/2018   Influenza-Unspecified 09/09/2015, 08/26/2017, 09/01/2020, 08/26/2022   Moderna Covid-19 Vaccine Bivalent Booster 52yrs & up 03/29/2022   Moderna Sars-Covid-2 Vaccination 08/26/2022   PFIZER Comirnaty(Gray Top)Covid-19 Tri-Sucrose Vaccine 05/11/2021   PFIZER(Purple Top)SARS-COV-2 Vaccination 12/18/2019, 01/08/2020, 09/19/2020   Pneumococcal Conjugate-13 09/15/2015   Pneumococcal Polysaccharide-23 10/14/2009   Td 10/14/2009   Tdap 05/11/2021   Zoster Recombinant(Shingrix) 07/22/2019, 05/30/2022    Screening Tests Health Maintenance  Topic Date Due   COVID-19 Vaccine (7 - 2024-25 season) 07/28/2023   Colonoscopy  08/20/2024 (Originally 09/04/2023)   INFLUENZA VACCINE  06/26/2024   Medicare Annual Wellness (AWV)  04/06/2025   DTaP/Tdap/Td (3 - Td or Tdap) 05/12/2031   Pneumonia Vaccine 32+ Years old  Completed   Zoster Vaccines- Shingrix  Completed   HPV VACCINES  Aged Out   Meningococcal B  Vaccine  Aged Out   Hepatitis C Screening  Discontinued    Health Maintenance  Health Maintenance Due  Topic Date Due   COVID-19 Vaccine (7 - 2024-25 season) 07/28/2023   Health Maintenance Items Addressed: Yes Patient aware of current care gaps.  Additional Screening:  Vision Screening: Recommended annual ophthalmology exams for early detection of glaucoma and other disorders of the eye.  Dental Screening: Recommended annual dental exams for proper oral hygiene  Community Resource Referral / Chronic Care Management: CRR required this visit?  No   CCM required this visit?  No   Plan:    I have personally reviewed and noted the following in the patient's chart:   Medical and social history Use of alcohol, tobacco or illicit drugs  Current medications and supplements including opioid prescriptions. Patient is not currently taking opioid prescriptions. Functional ability and status Nutritional status Physical activity Advanced directives List of other physicians Hospitalizations, surgeries, and ER visits in previous 12 months Vitals Screenings to include cognitive, depression, and falls Referrals and appointments  In addition, I have reviewed and discussed with patient certain preventive protocols, quality metrics, and best practice recommendations. A written personalized care plan for preventive services as well as general preventive health recommendations were provided to patient.   Margette Sheldon, LPN   3/55/7322   After Visit Summary: (MyChart) Due to this being a telephonic visit, the after visit summary with patients personalized plan was offered to patient via MyChart   Notes: None at this time.

## 2024-04-06 NOTE — Patient Instructions (Addendum)
 Mr. Vranich , Thank you for taking time out of your busy schedule to complete your Annual Wellness Visit with me. I enjoyed our conversation and look forward to speaking with you again next year. I, as well as your care team,  appreciate your ongoing commitment to your health goals. Please review the following plan we discussed and let me know if I can assist you in the future. Your Game plan/ To Do List    Referrals: If you haven't heard from the office you've been referred to, please reach out to them at the phone provided.   Follow up Visits: Next Medicare AWV with our clinical staff: 04/12/2025 at 1:00 pm PHONE VISIT  Have you seen your provider in the last 6 months (3 months if uncontrolled diabetes)? No Next Office Visit with your provider: Office will call to schedule  Clinician Recommendations:  Aim for 30 minutes of exercise or brisk walking, 6-8 glasses of water, and 5 servings of fruits and vegetables each day.       This is a list of the screening recommended for you and due dates:  Health Maintenance  Topic Date Due   COVID-19 Vaccine (7 - 2024-25 season) 07/28/2023   Colon Cancer Screening  08/20/2024*   Flu Shot  06/26/2024   Medicare Annual Wellness Visit  04/06/2025   DTaP/Tdap/Td vaccine (3 - Td or Tdap) 05/12/2031   Pneumonia Vaccine  Completed   Zoster (Shingles) Vaccine  Completed   HPV Vaccine  Aged Out   Meningitis B Vaccine  Aged Out   Hepatitis C Screening  Discontinued  *Topic was postponed. The date shown is not the original due date.    Advanced directives: (In Chart) A copy of your advanced directives are scanned into your chart should your provider ever need it. Advance Care Planning is important because it:  [x]  Makes sure you receive the medical care that is consistent with your values, goals, and preferences  [x]  It provides guidance to your family and loved ones and reduces their decisional burden about whether or not they are making the right  decisions based on your wishes.  Follow the link provided in your after visit summary or read over the paperwork we have mailed to you to help you started getting your Advance Directives in place. If you need assistance in completing these, please reach out to us  so that we can help you!  See attachments for Preventive Care and Fall Prevention Tips.

## 2024-04-07 DIAGNOSIS — M25519 Pain in unspecified shoulder: Secondary | ICD-10-CM | POA: Diagnosis not present

## 2024-04-07 DIAGNOSIS — R41841 Cognitive communication deficit: Secondary | ICD-10-CM | POA: Diagnosis not present

## 2024-04-07 DIAGNOSIS — R278 Other lack of coordination: Secondary | ICD-10-CM | POA: Diagnosis not present

## 2024-04-08 DIAGNOSIS — R41841 Cognitive communication deficit: Secondary | ICD-10-CM | POA: Diagnosis not present

## 2024-04-09 ENCOUNTER — Telehealth: Payer: Self-pay | Admitting: Family Medicine

## 2024-04-09 NOTE — Telephone Encounter (Signed)
 Called to schedule LVM to call back.   Thanks!

## 2024-04-09 NOTE — Telephone Encounter (Signed)
-----   Message from Kandis Ormond sent at 04/06/2024  4:08 PM EDT ----- Regarding: RE: APPOINTMENT Virtual visit is fine. ----- Message ----- From: Carolin Chyle Sent: 04/06/2024  12:50 PM EDT To: Kandis Ormond, DO Subject: FW: APPOINTMENT                                 ----- Message ----- From: Margette Sheldon, LPN Sent: 1/61/0960  12:43 PM EDT To: Bettyjane Brunet Admin Subject: APPOINTMENT                                    Patient is due for an office visit with Dr. Alverna John.  Patient stated that he has only seen her once since his PCP retired.  Patient does not have transportation but currently lives at [facility].  Patient has memory issues and would like to schedule a phone visit with her or virtual visit. I was informed by the front staff that Dr. Alverna John would have to approve virtual visits.  Shenika N. Glenna Lango, LPN [employer]

## 2024-04-10 DIAGNOSIS — R41841 Cognitive communication deficit: Secondary | ICD-10-CM | POA: Diagnosis not present

## 2024-04-13 DIAGNOSIS — R41841 Cognitive communication deficit: Secondary | ICD-10-CM | POA: Diagnosis not present

## 2024-04-14 DIAGNOSIS — R41841 Cognitive communication deficit: Secondary | ICD-10-CM | POA: Diagnosis not present

## 2024-04-14 DIAGNOSIS — M25519 Pain in unspecified shoulder: Secondary | ICD-10-CM | POA: Diagnosis not present

## 2024-04-14 DIAGNOSIS — R278 Other lack of coordination: Secondary | ICD-10-CM | POA: Diagnosis not present

## 2024-04-15 DIAGNOSIS — R41841 Cognitive communication deficit: Secondary | ICD-10-CM | POA: Diagnosis not present

## 2024-04-16 DIAGNOSIS — R278 Other lack of coordination: Secondary | ICD-10-CM | POA: Diagnosis not present

## 2024-04-16 DIAGNOSIS — R41841 Cognitive communication deficit: Secondary | ICD-10-CM | POA: Diagnosis not present

## 2024-04-16 DIAGNOSIS — M25519 Pain in unspecified shoulder: Secondary | ICD-10-CM | POA: Diagnosis not present

## 2024-04-21 ENCOUNTER — Telehealth: Payer: Self-pay

## 2024-04-21 DIAGNOSIS — R41841 Cognitive communication deficit: Secondary | ICD-10-CM | POA: Diagnosis not present

## 2024-04-21 DIAGNOSIS — R278 Other lack of coordination: Secondary | ICD-10-CM | POA: Diagnosis not present

## 2024-04-21 DIAGNOSIS — M25519 Pain in unspecified shoulder: Secondary | ICD-10-CM | POA: Diagnosis not present

## 2024-04-21 NOTE — Telephone Encounter (Signed)
 Received call from [place], SLP with [facility]. He is checking status of continuation orders for speech therapy.   Verified that we have received fax and that paperwork is in provider box for completion.   He is requesting as soon as possible so that he is able to see patient tomorrow.   Provided with verbal order and advised that I would send message to PCP for form completion.   Elsie Halo, RN

## 2024-04-22 DIAGNOSIS — R41841 Cognitive communication deficit: Secondary | ICD-10-CM | POA: Diagnosis not present

## 2024-04-22 NOTE — Telephone Encounter (Signed)
Form completed and placed in fax pile. 

## 2024-04-23 DIAGNOSIS — R41841 Cognitive communication deficit: Secondary | ICD-10-CM | POA: Diagnosis not present

## 2024-04-23 DIAGNOSIS — R278 Other lack of coordination: Secondary | ICD-10-CM | POA: Diagnosis not present

## 2024-04-23 DIAGNOSIS — M25519 Pain in unspecified shoulder: Secondary | ICD-10-CM | POA: Diagnosis not present

## 2024-04-24 DIAGNOSIS — R41841 Cognitive communication deficit: Secondary | ICD-10-CM | POA: Diagnosis not present

## 2024-04-27 DIAGNOSIS — R41841 Cognitive communication deficit: Secondary | ICD-10-CM | POA: Diagnosis not present

## 2024-04-28 DIAGNOSIS — M25519 Pain in unspecified shoulder: Secondary | ICD-10-CM | POA: Diagnosis not present

## 2024-04-28 DIAGNOSIS — R278 Other lack of coordination: Secondary | ICD-10-CM | POA: Diagnosis not present

## 2024-04-28 DIAGNOSIS — R41841 Cognitive communication deficit: Secondary | ICD-10-CM | POA: Diagnosis not present

## 2024-04-29 DIAGNOSIS — M25519 Pain in unspecified shoulder: Secondary | ICD-10-CM | POA: Diagnosis not present

## 2024-04-29 DIAGNOSIS — R278 Other lack of coordination: Secondary | ICD-10-CM | POA: Diagnosis not present

## 2024-04-29 DIAGNOSIS — R41841 Cognitive communication deficit: Secondary | ICD-10-CM | POA: Diagnosis not present

## 2024-05-01 DIAGNOSIS — R41841 Cognitive communication deficit: Secondary | ICD-10-CM | POA: Diagnosis not present

## 2024-05-04 DIAGNOSIS — R41841 Cognitive communication deficit: Secondary | ICD-10-CM | POA: Diagnosis not present

## 2024-05-05 DIAGNOSIS — M25519 Pain in unspecified shoulder: Secondary | ICD-10-CM | POA: Diagnosis not present

## 2024-05-05 DIAGNOSIS — R278 Other lack of coordination: Secondary | ICD-10-CM | POA: Diagnosis not present

## 2024-05-05 DIAGNOSIS — R41841 Cognitive communication deficit: Secondary | ICD-10-CM | POA: Diagnosis not present

## 2024-05-06 DIAGNOSIS — R41841 Cognitive communication deficit: Secondary | ICD-10-CM | POA: Diagnosis not present

## 2024-05-07 DIAGNOSIS — R278 Other lack of coordination: Secondary | ICD-10-CM | POA: Diagnosis not present

## 2024-05-07 DIAGNOSIS — R41841 Cognitive communication deficit: Secondary | ICD-10-CM | POA: Diagnosis not present

## 2024-05-07 DIAGNOSIS — M25519 Pain in unspecified shoulder: Secondary | ICD-10-CM | POA: Diagnosis not present

## 2024-05-08 DIAGNOSIS — R41841 Cognitive communication deficit: Secondary | ICD-10-CM | POA: Diagnosis not present

## 2024-05-11 DIAGNOSIS — R41841 Cognitive communication deficit: Secondary | ICD-10-CM | POA: Diagnosis not present

## 2024-05-12 DIAGNOSIS — R41841 Cognitive communication deficit: Secondary | ICD-10-CM | POA: Diagnosis not present

## 2024-05-12 DIAGNOSIS — R278 Other lack of coordination: Secondary | ICD-10-CM | POA: Diagnosis not present

## 2024-05-12 DIAGNOSIS — M25519 Pain in unspecified shoulder: Secondary | ICD-10-CM | POA: Diagnosis not present

## 2024-05-13 DIAGNOSIS — R41841 Cognitive communication deficit: Secondary | ICD-10-CM | POA: Diagnosis not present

## 2024-05-14 DIAGNOSIS — M25519 Pain in unspecified shoulder: Secondary | ICD-10-CM | POA: Diagnosis not present

## 2024-05-14 DIAGNOSIS — R278 Other lack of coordination: Secondary | ICD-10-CM | POA: Diagnosis not present

## 2024-05-14 DIAGNOSIS — R41841 Cognitive communication deficit: Secondary | ICD-10-CM | POA: Diagnosis not present

## 2024-05-15 DIAGNOSIS — R41841 Cognitive communication deficit: Secondary | ICD-10-CM | POA: Diagnosis not present

## 2024-05-18 DIAGNOSIS — R41841 Cognitive communication deficit: Secondary | ICD-10-CM | POA: Diagnosis not present

## 2024-05-19 DIAGNOSIS — R41841 Cognitive communication deficit: Secondary | ICD-10-CM | POA: Diagnosis not present

## 2024-05-19 DIAGNOSIS — M25519 Pain in unspecified shoulder: Secondary | ICD-10-CM | POA: Diagnosis not present

## 2024-05-19 DIAGNOSIS — R278 Other lack of coordination: Secondary | ICD-10-CM | POA: Diagnosis not present

## 2024-05-20 DIAGNOSIS — R41841 Cognitive communication deficit: Secondary | ICD-10-CM | POA: Diagnosis not present

## 2024-05-21 DIAGNOSIS — R41841 Cognitive communication deficit: Secondary | ICD-10-CM | POA: Diagnosis not present

## 2024-05-21 DIAGNOSIS — M25519 Pain in unspecified shoulder: Secondary | ICD-10-CM | POA: Diagnosis not present

## 2024-05-21 DIAGNOSIS — R278 Other lack of coordination: Secondary | ICD-10-CM | POA: Diagnosis not present

## 2024-05-25 DIAGNOSIS — R41841 Cognitive communication deficit: Secondary | ICD-10-CM | POA: Diagnosis not present

## 2024-05-26 DIAGNOSIS — R41841 Cognitive communication deficit: Secondary | ICD-10-CM | POA: Diagnosis not present

## 2024-05-27 DIAGNOSIS — R41841 Cognitive communication deficit: Secondary | ICD-10-CM | POA: Diagnosis not present

## 2024-05-29 DIAGNOSIS — R41841 Cognitive communication deficit: Secondary | ICD-10-CM | POA: Diagnosis not present

## 2024-06-01 DIAGNOSIS — R41841 Cognitive communication deficit: Secondary | ICD-10-CM | POA: Diagnosis not present

## 2024-06-03 DIAGNOSIS — R41841 Cognitive communication deficit: Secondary | ICD-10-CM | POA: Diagnosis not present

## 2024-06-05 DIAGNOSIS — R41841 Cognitive communication deficit: Secondary | ICD-10-CM | POA: Diagnosis not present

## 2024-06-08 DIAGNOSIS — R41841 Cognitive communication deficit: Secondary | ICD-10-CM | POA: Diagnosis not present

## 2024-06-10 DIAGNOSIS — R41841 Cognitive communication deficit: Secondary | ICD-10-CM | POA: Diagnosis not present

## 2024-06-12 ENCOUNTER — Other Ambulatory Visit: Payer: Self-pay | Admitting: Family Medicine

## 2024-06-12 DIAGNOSIS — G3184 Mild cognitive impairment, so stated: Secondary | ICD-10-CM

## 2024-06-12 DIAGNOSIS — R41841 Cognitive communication deficit: Secondary | ICD-10-CM | POA: Diagnosis not present

## 2024-06-15 DIAGNOSIS — R41841 Cognitive communication deficit: Secondary | ICD-10-CM | POA: Diagnosis not present

## 2024-06-17 DIAGNOSIS — R41841 Cognitive communication deficit: Secondary | ICD-10-CM | POA: Diagnosis not present

## 2024-06-18 DIAGNOSIS — R41841 Cognitive communication deficit: Secondary | ICD-10-CM | POA: Diagnosis not present

## 2024-06-22 DIAGNOSIS — R41841 Cognitive communication deficit: Secondary | ICD-10-CM | POA: Diagnosis not present

## 2024-06-24 DIAGNOSIS — R41841 Cognitive communication deficit: Secondary | ICD-10-CM | POA: Diagnosis not present

## 2024-06-24 DIAGNOSIS — M25511 Pain in right shoulder: Secondary | ICD-10-CM | POA: Diagnosis not present

## 2024-06-24 DIAGNOSIS — M6281 Muscle weakness (generalized): Secondary | ICD-10-CM | POA: Diagnosis not present

## 2024-06-26 DIAGNOSIS — R41841 Cognitive communication deficit: Secondary | ICD-10-CM | POA: Diagnosis not present

## 2024-06-29 DIAGNOSIS — M25511 Pain in right shoulder: Secondary | ICD-10-CM | POA: Diagnosis not present

## 2024-06-29 DIAGNOSIS — M6281 Muscle weakness (generalized): Secondary | ICD-10-CM | POA: Diagnosis not present

## 2024-06-29 DIAGNOSIS — R41841 Cognitive communication deficit: Secondary | ICD-10-CM | POA: Diagnosis not present

## 2024-07-01 DIAGNOSIS — R41841 Cognitive communication deficit: Secondary | ICD-10-CM | POA: Diagnosis not present

## 2024-07-06 ENCOUNTER — Ambulatory Visit: Payer: Self-pay

## 2024-07-06 DIAGNOSIS — W540XXA Bitten by dog, initial encounter: Secondary | ICD-10-CM | POA: Diagnosis not present

## 2024-07-06 DIAGNOSIS — T148XXA Other injury of unspecified body region, initial encounter: Secondary | ICD-10-CM | POA: Diagnosis not present

## 2024-07-06 NOTE — Telephone Encounter (Signed)
 FYI Only or Action Required?: FYI only for provider.  Patient was last seen in primary care on 08/09/2023 by Rumball, Alison M, DO.  Called Nurse Triage reporting Animal Bite.  Symptoms began today.  Interventions attempted: Rest, hydration, or home remedies.  Symptoms are: unchanged.  Triage Disposition: See HCP Within 4 Hours (Or PCP Triage)  Patient/caregiver understands and will follow disposition?: Yes, will follow disposition  Harlene is calling to speak to the [facility]'s nurse line about some antibiotics for the patient. She was able to leave a voicemail at [phone], but does not want to wait 24-48 hours to speak to someone. Unable to locate a number to the nurse line. Please contact her back at [phone].   Reason for Disposition  [1] Puncture wound or small cut AND [2] on face  (Exception: Puncture  from small pet such as gerbil, mouse, hamster, puppy.)  Answer Assessment - Initial Assessment Questions 1. APPEARANCE What does it look like?  (e.g., abrasion, bruise, puncture)      Puncture on nose and cheek 2. SIZE: How big is the bite? (e.g., inches, cm; or compare to size of coin, pea, grape, ping pong ball)      small 3. LOCATION: Where is the bite located?      face 4. ONSET: When did the bite happen? (e.g., minutes, hours ago)      Hours ago, about 3 hrs 5. ANIMAL: What type of animal caused the bite? Is the injury from a bite or a claw? If the animal is a dog or a cat, ask: Was it a pet or a stray? Was it acting ill or behaving strangely?     dog  Triage was not completed fully as daughter states that they are at the [facility] waiting to be evaluated. Daughter was advised to be seen at the [facility] and not to leave.  Protocols used: Animal Bite-A-AH

## 2024-07-15 DIAGNOSIS — R41841 Cognitive communication deficit: Secondary | ICD-10-CM | POA: Diagnosis not present

## 2024-07-16 DIAGNOSIS — M25511 Pain in right shoulder: Secondary | ICD-10-CM | POA: Diagnosis not present

## 2024-07-16 DIAGNOSIS — M6281 Muscle weakness (generalized): Secondary | ICD-10-CM | POA: Diagnosis not present

## 2024-07-17 DIAGNOSIS — R41841 Cognitive communication deficit: Secondary | ICD-10-CM | POA: Diagnosis not present

## 2024-07-20 DIAGNOSIS — R41841 Cognitive communication deficit: Secondary | ICD-10-CM | POA: Diagnosis not present

## 2024-07-22 DIAGNOSIS — R41841 Cognitive communication deficit: Secondary | ICD-10-CM | POA: Diagnosis not present

## 2024-07-23 DIAGNOSIS — M25511 Pain in right shoulder: Secondary | ICD-10-CM | POA: Diagnosis not present

## 2024-07-23 DIAGNOSIS — M6281 Muscle weakness (generalized): Secondary | ICD-10-CM | POA: Diagnosis not present

## 2024-07-23 DIAGNOSIS — R41841 Cognitive communication deficit: Secondary | ICD-10-CM | POA: Diagnosis not present

## 2024-07-27 DIAGNOSIS — R41841 Cognitive communication deficit: Secondary | ICD-10-CM | POA: Diagnosis not present

## 2024-07-29 DIAGNOSIS — R41841 Cognitive communication deficit: Secondary | ICD-10-CM | POA: Diagnosis not present

## 2024-07-29 DIAGNOSIS — M25511 Pain in right shoulder: Secondary | ICD-10-CM | POA: Diagnosis not present

## 2024-07-29 DIAGNOSIS — M6281 Muscle weakness (generalized): Secondary | ICD-10-CM | POA: Diagnosis not present

## 2024-07-31 DIAGNOSIS — R41841 Cognitive communication deficit: Secondary | ICD-10-CM | POA: Diagnosis not present

## 2024-08-03 DIAGNOSIS — R41841 Cognitive communication deficit: Secondary | ICD-10-CM | POA: Diagnosis not present

## 2024-08-05 DIAGNOSIS — M25511 Pain in right shoulder: Secondary | ICD-10-CM | POA: Diagnosis not present

## 2024-08-05 DIAGNOSIS — M6281 Muscle weakness (generalized): Secondary | ICD-10-CM | POA: Diagnosis not present

## 2024-08-06 DIAGNOSIS — R41841 Cognitive communication deficit: Secondary | ICD-10-CM | POA: Diagnosis not present

## 2024-08-11 DIAGNOSIS — R41841 Cognitive communication deficit: Secondary | ICD-10-CM | POA: Diagnosis not present

## 2024-08-12 DIAGNOSIS — R41841 Cognitive communication deficit: Secondary | ICD-10-CM | POA: Diagnosis not present

## 2024-08-12 DIAGNOSIS — M25511 Pain in right shoulder: Secondary | ICD-10-CM | POA: Diagnosis not present

## 2024-08-12 DIAGNOSIS — M6281 Muscle weakness (generalized): Secondary | ICD-10-CM | POA: Diagnosis not present

## 2024-08-14 DIAGNOSIS — R41841 Cognitive communication deficit: Secondary | ICD-10-CM | POA: Diagnosis not present

## 2024-08-17 DIAGNOSIS — R41841 Cognitive communication deficit: Secondary | ICD-10-CM | POA: Diagnosis not present

## 2024-08-19 DIAGNOSIS — M25511 Pain in right shoulder: Secondary | ICD-10-CM | POA: Diagnosis not present

## 2024-08-19 DIAGNOSIS — M6281 Muscle weakness (generalized): Secondary | ICD-10-CM | POA: Diagnosis not present

## 2024-08-19 DIAGNOSIS — R41841 Cognitive communication deficit: Secondary | ICD-10-CM | POA: Diagnosis not present

## 2024-08-24 DIAGNOSIS — R41841 Cognitive communication deficit: Secondary | ICD-10-CM | POA: Diagnosis not present

## 2024-08-26 DIAGNOSIS — M6281 Muscle weakness (generalized): Secondary | ICD-10-CM | POA: Diagnosis not present

## 2024-08-26 DIAGNOSIS — R41841 Cognitive communication deficit: Secondary | ICD-10-CM | POA: Diagnosis not present

## 2024-08-26 DIAGNOSIS — M25511 Pain in right shoulder: Secondary | ICD-10-CM | POA: Diagnosis not present

## 2024-08-27 DIAGNOSIS — R41841 Cognitive communication deficit: Secondary | ICD-10-CM | POA: Diagnosis not present

## 2024-08-31 DIAGNOSIS — R41841 Cognitive communication deficit: Secondary | ICD-10-CM | POA: Diagnosis not present

## 2024-09-02 DIAGNOSIS — R41841 Cognitive communication deficit: Secondary | ICD-10-CM | POA: Diagnosis not present

## 2024-09-02 DIAGNOSIS — M6281 Muscle weakness (generalized): Secondary | ICD-10-CM | POA: Diagnosis not present

## 2024-09-02 DIAGNOSIS — M25511 Pain in right shoulder: Secondary | ICD-10-CM | POA: Diagnosis not present

## 2024-09-03 DIAGNOSIS — R41841 Cognitive communication deficit: Secondary | ICD-10-CM | POA: Diagnosis not present

## 2024-09-07 DIAGNOSIS — R41841 Cognitive communication deficit: Secondary | ICD-10-CM | POA: Diagnosis not present

## 2024-09-09 ENCOUNTER — Encounter: Payer: Self-pay | Admitting: Internal Medicine

## 2024-09-09 ENCOUNTER — Non-Acute Institutional Stay: Admitting: Internal Medicine

## 2024-09-09 ENCOUNTER — Ambulatory Visit: Admitting: Internal Medicine

## 2024-09-09 VITALS — BP 128/66 | HR 62 | Temp 97.3°F | Resp 18 | Ht 73.0 in | Wt 166.8 lb

## 2024-09-09 DIAGNOSIS — E7849 Other hyperlipidemia: Secondary | ICD-10-CM | POA: Diagnosis not present

## 2024-09-09 DIAGNOSIS — M25511 Pain in right shoulder: Secondary | ICD-10-CM | POA: Diagnosis not present

## 2024-09-09 DIAGNOSIS — E538 Deficiency of other specified B group vitamins: Secondary | ICD-10-CM | POA: Diagnosis not present

## 2024-09-09 DIAGNOSIS — F02A0 Dementia in other diseases classified elsewhere, mild, without behavioral disturbance, psychotic disturbance, mood disturbance, and anxiety: Secondary | ICD-10-CM | POA: Diagnosis not present

## 2024-09-09 DIAGNOSIS — F332 Major depressive disorder, recurrent severe without psychotic features: Secondary | ICD-10-CM

## 2024-09-09 DIAGNOSIS — M6281 Muscle weakness (generalized): Secondary | ICD-10-CM | POA: Diagnosis not present

## 2024-09-09 DIAGNOSIS — G301 Alzheimer's disease with late onset: Secondary | ICD-10-CM | POA: Diagnosis not present

## 2024-09-09 DIAGNOSIS — R41841 Cognitive communication deficit: Secondary | ICD-10-CM | POA: Diagnosis not present

## 2024-09-09 MED ORDER — MEMANTINE HCL 5 MG PO TABS: 5.0000 mg | ORAL_TABLET | Freq: Every day | ORAL | 1 refills | Status: DC

## 2024-09-09 NOTE — Patient Instructions (Addendum)
 1) Labs will be done on September 17, 2024 at [facility] at 7:45 am   Covid and Flu shot from [facility]  Adding Namenda 5 mg QAm

## 2024-09-09 NOTE — Progress Notes (Unsigned)
 Location:  [facility] of Service:  Clinic (12)  Provider:   Code Status:  Goals of Care:     04/06/2024   12:03 PM  Advanced Directives  Does Patient Have a Medical Advance Directive? Yes  Type of Estate agent of [place];Living will  Does patient want to make changes to medical advance directive? No - Patient declined  Copy of Healthcare Power of Attorney in Chart? Yes - validated most recent copy scanned in chart (See row information)     Chief Complaint  Patient presents with   Establish Care    New Patient    HPI: Patient is a 83 y.o. male seen today for medical management of chronic diseases.    Patient lives in [facility]  [place] since last year.  [place]  Does have caregivers. Most of the history was obtained by reviewing the chart and talking to his daughter and caregiver. It seems patient was diagnosed with cognitive impairment few years ago. He had a CT in 2023 which showed atrophy and chronic microvascular ischemic disease. He has been on Aricept  since 12/2021 Twana whichs Never Seen Neurologist  He has 2 daughters both are Physicians.   I connected with one of the daughter on phone and she said that they have recently seen worsening in his cognition.  Also patient gets very anxious when they take him out.  He is otherwise staying independent in his ADLs  Discussed the use of AI scribe software for clinical note transcription with the patient, who gave verbal consent to proceed.  History of Present Illness   Stephen [name] is an 83 year old male who presents with memory issues.  He experiences memory issues, including difficulty understanding time and managing his calendar. He relies on his caregiver for assistance with appointments and daily activities. He has not consulted a neurologist for these concerns.   stopped driving several years ago. He relies on walking and his caregiver for transportation. His  medication regimen includes Aricept , a nasal spray, and lion's mane supplements.  SABRA He plays pickleball regularly and has not had any recent falls. He has no issues with urination or bowel movements and uses over-the-counter reading glasses. An eye appointment is scheduled for February, and he has only seen a dentist       Past Medical History:  Diagnosis Date   Medical history non-contributory    Melanoma in situ of back (HCC) 2015    Past Surgical History:  Procedure Laterality Date   AXILLARY SENTINEL NODE BIOPSY Right 03/05/2013   Procedure: AXILLARY SENTINEL NODE BIOPSY;  Surgeon: Donnice Bury, MD;  [facility]: [facility];  Service: General;  Laterality: Right;   basal /sq cell removal  2014   x-4 times removal   EXCISION MELANOMA WITH SENTINEL LYMPH NODE BIOPSY Right 03/05/2013   Procedure: WIDE LOCAL EXCISION RIGHT UPPER BACK  MELANOMA WITH RIGHT AXILLARYSENTINEL NODE BIOPSY;  Surgeon: Donnice Bury, MD;  Location: [facility] OR;  Service: General;  Laterality: Right;    Allergies  Allergen Reactions   Naproxen Nausea Only    Light headed    Outpatient Encounter Medications as of 09/09/2024  Medication Sig   acetaminophen  (TYLENOL ) 500 MG tablet Take 500 mg by mouth every 6 (six) hours as needed.   Azelastine  HCl 137 MCG/SPRAY SOLN PLACE 2 SPRAYS INTO BOTH NOSTRILS TWO TIMES A DAY   Calcium Carb-Cholecalciferol (CALCIUM-VITAMIN D) 500-200 MG-UNIT tablet Take  1 tablet by mouth daily.   carboxymethylcellulose (REFRESH PLUS) 0.5 % SOLN Place 1 drop into the left eye as needed.   donepezil  (ARICEPT ) 10 MG tablet TAKE 1 TABLET BY MOUTH AT BEDTIME   UNABLE TO FIND Take 2,000 mg by mouth daily in the afternoon. Med Name: Om Lion's Mane Mushroom Capsule   hydrocortisone valerate cream (WESTCORT) 0.2 % Apply 1 application topically daily as needed (dry skin).  (Patient not taking: Reported on 09/09/2024)   No facility-administered encounter medications on file as of 09/09/2024.     Review of Systems:  Review of Systems  Unable to perform ROS: Dementia    Health Maintenance  Topic Date Due   Colonoscopy  09/04/2023   Influenza Vaccine  06/26/2024   COVID-19 Vaccine (7 - 2025-26 season) 07/27/2024   Medicare Annual Wellness (AWV)  04/06/2025   DTaP/Tdap/Td (3 - Td or Tdap) 05/12/2031   Pneumococcal Vaccine: 50+ Years  Completed   Zoster Vaccines- Shingrix  Completed   Meningococcal B Vaccine  Aged Out   Hepatitis C Screening  Discontinued    Physical Exam: Vitals:   09/09/24 1349  BP: 128/66  Pulse: 62  Resp: 18  Temp: (!) 97.3 F (36.3 C)  SpO2: 97%  Weight: 166 lb 12.8 oz (75.7 kg)  Height: 6' 1 (1.854 m)   Body mass index is 22.01 kg/m. Physical Exam Vitals reviewed.  Constitutional:      Appearance: Normal appearance.  HENT:     Head: Normocephalic.     Nose: Nose normal.     Mouth/Throat:     Mouth: Mucous membranes are moist.     Pharynx: Oropharynx is clear.  Eyes:     Pupils: Pupils are equal, round, and reactive to light.  Cardiovascular:     Rate and Rhythm: Normal rate and regular rhythm.     Pulses: Normal pulses.     Heart sounds: No murmur heard. Pulmonary:     Effort: Pulmonary effort is normal. No respiratory distress.     Breath sounds: Normal breath sounds. No rales.  Abdominal:     General: Abdomen is flat. Bowel sounds are normal.     Palpations: Abdomen is soft.  Musculoskeletal:        General: No swelling.     Cervical back: Neck supple.  Skin:    General: Skin is warm.  Neurological:     General: No focal deficit present.     Mental Status: He is alert.     Comments: Patient had Aphasia but was able to come up with the words eventually He had No Focal Deficits Walks with No assist Able to follow all Commands Did not know his age or YEar    Psychiatric:        Mood and Affect: Mood normal.        Thought Content: Thought content normal.     Labs reviewed: Basic Metabolic Panel: No results  for input(s): NA, K, CL, CO2, GLUCOSE, BUN, CREATININE, CALCIUM, MG, PHOS, TSH in the last 8760 hours. Liver Function Tests: No results for input(s): AST, ALT, ALKPHOS, BILITOT, PROT, ALBUMIN in the last 8760 hours. No results for input(s): LIPASE, AMYLASE in the last 8760 hours. No results for input(s): AMMONIA in the last 8760 hours. CBC: No results for input(s): WBC, NEUTROABS, HGB, HCT, MCV, PLT in the last 8760 hours. Lipid Panel: No results for input(s): CHOL, HDL, LDLCALC, TRIG, CHOLHDL, LDLDIRECT in the last 8760 hours. No results found for:  HGBA1C  Procedures since last visit: No results found.  Assessment/Plan 1. 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 Patient did have chronic ischemic changes on his CT scan. He is already on Aricept  we will start him on Namenda 5 mg daily Discussed with the speech therapy to organize his medications so that eventually we can add another dose in the evening. At this time patient has been able to manage [place] with caregivers.  2. B12 deficiency Previous history will check the level again 3 depression  Some component of depression since he lost his wife few years ago.  Will continue to monitor.  Was on Celexa which was later changed to Zoloft  in the past     Labs/tests ordered:   Next appt:  Visit date not found  Total time spent in this patient care encounter was 60 _  minutes; greater than 50% of the visit spent counseling patient his daughter  and staff, reviewing records , Labs and coordinating care for problems addressed at this encounter.

## 2024-09-10 DIAGNOSIS — G301 Alzheimer's disease with late onset: Secondary | ICD-10-CM | POA: Insufficient documentation

## 2024-09-10 DIAGNOSIS — E7849 Other hyperlipidemia: Secondary | ICD-10-CM | POA: Insufficient documentation

## 2024-09-14 DIAGNOSIS — R41841 Cognitive communication deficit: Secondary | ICD-10-CM | POA: Diagnosis not present

## 2024-09-16 DIAGNOSIS — R41841 Cognitive communication deficit: Secondary | ICD-10-CM | POA: Diagnosis not present

## 2024-09-18 DIAGNOSIS — R41841 Cognitive communication deficit: Secondary | ICD-10-CM | POA: Diagnosis not present

## 2024-09-21 DIAGNOSIS — R41841 Cognitive communication deficit: Secondary | ICD-10-CM | POA: Diagnosis not present

## 2024-09-23 DIAGNOSIS — R41841 Cognitive communication deficit: Secondary | ICD-10-CM | POA: Diagnosis not present

## 2024-09-25 DIAGNOSIS — R41841 Cognitive communication deficit: Secondary | ICD-10-CM | POA: Diagnosis not present

## 2024-09-28 DIAGNOSIS — R41841 Cognitive communication deficit: Secondary | ICD-10-CM | POA: Diagnosis not present

## 2024-09-30 DIAGNOSIS — R41841 Cognitive communication deficit: Secondary | ICD-10-CM | POA: Diagnosis not present

## 2024-10-01 ENCOUNTER — Other Ambulatory Visit: Payer: Self-pay | Admitting: Internal Medicine

## 2024-10-01 DIAGNOSIS — E538 Deficiency of other specified B group vitamins: Secondary | ICD-10-CM | POA: Diagnosis not present

## 2024-10-01 DIAGNOSIS — J310 Chronic rhinitis: Secondary | ICD-10-CM

## 2024-10-01 DIAGNOSIS — F02A0 Dementia in other diseases classified elsewhere, mild, without behavioral disturbance, psychotic disturbance, mood disturbance, and anxiety: Secondary | ICD-10-CM | POA: Diagnosis not present

## 2024-10-01 DIAGNOSIS — F332 Major depressive disorder, recurrent severe without psychotic features: Secondary | ICD-10-CM | POA: Diagnosis not present

## 2024-10-01 DIAGNOSIS — E7849 Other hyperlipidemia: Secondary | ICD-10-CM | POA: Diagnosis not present

## 2024-10-01 DIAGNOSIS — G301 Alzheimer's disease with late onset: Secondary | ICD-10-CM | POA: Diagnosis not present

## 2024-10-01 MED ORDER — AZELASTINE HCL 137 MCG/SPRAY NA SOLN: 137.0000 ug | Freq: Two times a day (BID) | NASAL | 3 refills | Status: AC

## 2024-10-01 NOTE — Telephone Encounter (Unsigned)
 Copied from CRM [id number]. Topic: Clinical - Medication Refill >> Oct 01, 2024 10:21 AM Stephen [name] wrote: Medication: Azelastine  HCl 137 MCG/SPRAY SOLN  Has the patient contacted their pharmacy? Yes (Agent: If no, request that the patient contact the pharmacy for the refill. If patient does not wish to contact the pharmacy document the reason why and proceed with request.) (Agent: If yes, when and what did the pharmacy advise?)  This is the patient's preferred pharmacy:  [facility] [id number] [place], [place] - [address] Stephen [name] [place] [address] Phone: [phone] Fax: [phone]  Is this the correct pharmacy for this prescription? Yes If no, delete pharmacy and type the correct one.   Has the prescription been filled recently? No  Is the patient out of the medication? Yes  Has the patient been seen for an appointment in the last year OR does the patient have an upcoming appointment? Yes  Can we respond through MyChart? Yes  Agent: Please be advised that Rx refills may take up to 3 business days. We ask that you follow-up with your pharmacy.

## 2024-10-01 NOTE — Telephone Encounter (Signed)
Patient requested refill °Pended Rx and sent to Dr. [name] for approval.  °

## 2024-10-02 DIAGNOSIS — R41841 Cognitive communication deficit: Secondary | ICD-10-CM | POA: Diagnosis not present

## 2024-10-05 ENCOUNTER — Telehealth: Payer: Self-pay

## 2024-10-05 DIAGNOSIS — R41841 Cognitive communication deficit: Secondary | ICD-10-CM | POA: Diagnosis not present

## 2024-10-05 LAB — CBC WITH DIFFERENTIAL/PLATELET
Absolute Lymphocytes: 914 {cells}/uL (ref 850–3900)
Absolute Monocytes: 211 {cells}/uL (ref 200–950)
Basophils Absolute: 50 {cells}/uL (ref 0–200)
Basophils Relative: 1.5 %
Eosinophils Absolute: 40 {cells}/uL (ref 15–500)
Eosinophils Relative: 1.2 %
HCT: 46.4 % (ref 38.5–50.0)
Hemoglobin: 15.5 g/dL (ref 13.2–17.1)
MCH: 32 pg (ref 27.0–33.0)
MCHC: 33.4 g/dL (ref 32.0–36.0)
MCV: 95.7 fL (ref 80.0–100.0)
MPV: 11.1 fL (ref 7.5–12.5)
Monocytes Relative: 6.4 %
Neutro Abs: 2086 {cells}/uL (ref 1500–7800)
Neutrophils Relative %: 63.2 %
Platelets: 134 Thousand/uL — ABNORMAL LOW (ref 140–400)
RBC: 4.85 Million/uL (ref 4.20–5.80)
RDW: 11.9 % (ref 11.0–15.0)
Total Lymphocyte: 27.7 %
WBC: 3.3 Thousand/uL — ABNORMAL LOW (ref 3.8–10.8)

## 2024-10-05 LAB — COMPLETE METABOLIC PANEL WITHOUT GFR
AG Ratio: 2 (calc) (ref 1.0–2.5)
ALT: 18 U/L (ref 9–46)
AST: 25 U/L (ref 10–35)
Albumin: 4.5 g/dL (ref 3.6–5.1)
Alkaline phosphatase (APISO): 59 U/L (ref 35–144)
BUN: 23 mg/dL (ref 7–25)
CO2: 29 mmol/L (ref 20–32)
Calcium: 9.3 mg/dL (ref 8.6–10.3)
Chloride: 103 mmol/L (ref 98–110)
Creat: 1.16 mg/dL (ref 0.70–1.22)
Globulin: 2.2 g/dL (ref 1.9–3.7)
Glucose, Bld: 93 mg/dL (ref 65–99)
Potassium: 4.3 mmol/L (ref 3.5–5.3)
Sodium: 140 mmol/L (ref 135–146)
Total Bilirubin: 0.8 mg/dL (ref 0.2–1.2)
Total Protein: 6.7 g/dL (ref 6.1–8.1)

## 2024-10-05 LAB — LIPID PANEL
Cholesterol: 179 mg/dL (ref <200)
HDL: 66 mg/dL (ref >=40)
LDL Cholesterol (Calc): 99 mg/dL
Non-HDL Cholesterol (Calc): 113 mg/dL (ref <130)
Total CHOL/HDL Ratio: 2.7 (calc) (ref <5.0)
Triglycerides: 53 mg/dL (ref <150)

## 2024-10-05 LAB — VITAMIN B12: Vitamin B-12: 298 pg/mL (ref 200–1100)

## 2024-10-05 LAB — QUEST AD-DETECT PHOSPHORYLATED TAU217(P-TAU217), PLASMA: Quest Detect PTAU217, Plasma: 1.07 pg/mL — ABNORMAL HIGH (ref <=0.15)

## 2024-10-05 LAB — TSH: TSH: 1.95 m[IU]/L (ref 0.40–4.50)

## 2024-10-05 NOTE — Telephone Encounter (Signed)
 Copied from CRM [id number]. Topic: Clinical - Medication Refill >> Oct 01, 2024 10:21 AM Stephen [name] wrote: Medication: Azelastine  HCl 137 MCG/SPRAY SOLN  Has the patient contacted their pharmacy? Yes (Agent: If no, request that the patient contact the pharmacy for the refill. If patient does not wish to contact the pharmacy document the reason why and proceed with request.) (Agent: If yes, when and what did the pharmacy advise?)  This is the patient's preferred pharmacy:  [facility] [id number] [place], [place] - [name] [address] Phone: [phone] Fax: [phone]  Is this the correct pharmacy for this prescription? Yes If no, delete pharmacy and type the correct one.   Has the prescription been filled recently? No  Is the patient out of the medication? Yes  Has the patient been seen for an appointment in the last year OR does the patient have an upcoming appointment? Yes  Can we respond through MyChart? Yes  Agent: Please be advised that Rx refills may take up to 3 business days. We ask that you follow-up with your pharmacy. >> Oct 05, 2024  2:20 PM Stephen [name] wrote: Patients caregiver calling again after speaking with pharmacy and the refill has not been filled for the patient and she is double checking the status  Caregiver [name] [phone]  Pt num  [phone] [facility])       [facility] [address] [place] [address] Phone: [phone] Fax: [phone]

## 2024-10-05 NOTE — Telephone Encounter (Signed)
 RX was approved last week. Left message on voicemail informing patient and Grayce of status

## 2024-10-06 ENCOUNTER — Other Ambulatory Visit: Payer: Self-pay

## 2024-10-06 NOTE — Telephone Encounter (Signed)
 Tried contacting caregiver and patient regarding medication being sent to Pharmacy 10/01/24.  Received fax from a different location [employer] and wanted to confirm which pharmacy was necessary. It states in yesterday rx that [facility] [address] is the correct one.

## 2024-10-07 DIAGNOSIS — R41841 Cognitive communication deficit: Secondary | ICD-10-CM | POA: Diagnosis not present

## 2024-10-08 ENCOUNTER — Ambulatory Visit: Payer: Self-pay | Admitting: Nurse Practitioner

## 2024-10-09 DIAGNOSIS — R41841 Cognitive communication deficit: Secondary | ICD-10-CM | POA: Diagnosis not present

## 2024-10-12 DIAGNOSIS — R41841 Cognitive communication deficit: Secondary | ICD-10-CM | POA: Diagnosis not present

## 2024-10-14 DIAGNOSIS — R41841 Cognitive communication deficit: Secondary | ICD-10-CM | POA: Diagnosis not present

## 2024-10-16 DIAGNOSIS — R41841 Cognitive communication deficit: Secondary | ICD-10-CM | POA: Diagnosis not present

## 2024-10-19 DIAGNOSIS — R41841 Cognitive communication deficit: Secondary | ICD-10-CM | POA: Diagnosis not present

## 2024-10-21 DIAGNOSIS — R41841 Cognitive communication deficit: Secondary | ICD-10-CM | POA: Diagnosis not present

## 2024-10-26 DIAGNOSIS — R41841 Cognitive communication deficit: Secondary | ICD-10-CM | POA: Diagnosis not present

## 2024-10-28 DIAGNOSIS — R41841 Cognitive communication deficit: Secondary | ICD-10-CM | POA: Diagnosis not present

## 2024-10-30 DIAGNOSIS — R41841 Cognitive communication deficit: Secondary | ICD-10-CM | POA: Diagnosis not present

## 2024-11-02 ENCOUNTER — Other Ambulatory Visit: Payer: Self-pay | Admitting: *Deleted

## 2024-11-02 DIAGNOSIS — G3184 Mild cognitive impairment, so stated: Secondary | ICD-10-CM

## 2024-11-02 MED ORDER — DONEPEZIL HCL 10 MG PO TABS: 10.0000 mg | ORAL_TABLET | Freq: Every day | ORAL | 1 refills | Status: AC

## 2024-11-02 NOTE — Telephone Encounter (Signed)
 Pharmacy requested refill

## 2024-11-03 DIAGNOSIS — D225 Melanocytic nevi of trunk: Secondary | ICD-10-CM | POA: Diagnosis not present

## 2024-11-03 DIAGNOSIS — L218 Other seborrheic dermatitis: Secondary | ICD-10-CM | POA: Diagnosis not present

## 2024-11-03 DIAGNOSIS — L821 Other seborrheic keratosis: Secondary | ICD-10-CM | POA: Diagnosis not present

## 2024-11-03 DIAGNOSIS — L905 Scar conditions and fibrosis of skin: Secondary | ICD-10-CM | POA: Diagnosis not present

## 2024-11-03 DIAGNOSIS — Z85828 Personal history of other malignant neoplasm of skin: Secondary | ICD-10-CM | POA: Diagnosis not present

## 2024-11-03 DIAGNOSIS — L570 Actinic keratosis: Secondary | ICD-10-CM | POA: Diagnosis not present

## 2024-11-03 DIAGNOSIS — Z85820 Personal history of malignant melanoma of skin: Secondary | ICD-10-CM | POA: Diagnosis not present

## 2024-12-02 ENCOUNTER — Non-Acute Institutional Stay: Payer: Self-pay | Admitting: Internal Medicine

## 2024-12-02 ENCOUNTER — Encounter: Payer: Self-pay | Admitting: Internal Medicine

## 2024-12-02 VITALS — BP 138/80 | HR 56 | Temp 97.6°F | Ht 73.0 in | Wt 172.0 lb

## 2024-12-02 DIAGNOSIS — F02A0 Dementia in other diseases classified elsewhere, mild, without behavioral disturbance, psychotic disturbance, mood disturbance, and anxiety: Secondary | ICD-10-CM | POA: Diagnosis not present

## 2024-12-02 DIAGNOSIS — H9193 Unspecified hearing loss, bilateral: Secondary | ICD-10-CM | POA: Diagnosis not present

## 2024-12-02 DIAGNOSIS — G301 Alzheimer's disease with late onset: Secondary | ICD-10-CM | POA: Diagnosis not present

## 2024-12-02 DIAGNOSIS — E538 Deficiency of other specified B group vitamins: Secondary | ICD-10-CM | POA: Diagnosis not present

## 2024-12-02 MED ORDER — MEMANTINE HCL 5 MG PO TABS: 5.0000 mg | ORAL_TABLET | Freq: Two times a day (BID) | ORAL | Status: AC

## 2024-12-02 NOTE — Progress Notes (Signed)
 "  Location:   [facility] Curator of Service:  Clinic   Provider:   Code Status:  Goals of Care:     04/06/2024   12:03 PM  Advanced Directives  Does Patient Have a Medical Advance Directive? Yes  Type of Estate Agent of [place];Living will  Does patient want to make changes to medical advance directive? No - Patient declined  Copy of Healthcare Power of Attorney in Chart? Yes - validated most recent copy scanned in chart (See row information)     Chief Complaint  Patient presents with   Medical Management of Chronic Issues    Three Months Follow-up. Discuss the need for colonoscopy. Also would like to discuss hearing issues.      HPI: Patient Stephen [name] a 84 y.o. male seen today for medical management of chronic diseases.  Discussed the use of AI scribe software for clinical note transcription with the patient, who gave verbal consent to proceed.  History of Present Illness   Stephen Stephen [name] an 84 year old male who presents with hearing difficulties.  He has had several months of significant hearing difficulty. He received new hearing aids in April. Since November his hearing has been variable despite cleaning obstructing debris and having the connecting line replaced. He still has inconsistent hearing day to day and Stephen [name] awaiting a new part for the device.  He uses a pill box and written notes to manage medications twice daily and occasionally misses doses. His caregiver helps monitor and support adherence.  He does water aerobics three times weekly and walks his dog. He prefers water aerobics for safety. He denies recent falls or accidents and has no urinary or stool incontinence.  His caregiver assists with grocery shopping, appointments, and calendar management. He prepares his own breakfast and lunch. Dinner Stephen [name] usually taken at a community table.  He reads newspapers and magazines regularly. He has difficulty with time, dates, and calendar  management, and his caregiver provides support for these tasks.      Previous history Patient lives in [facility]  [place] since last year.  [name]  Does have caregivers. Most of the history was obtained by reviewing the chart and talking to his daughter and caregiver. It seems patient was diagnosed with cognitive impairment few years ago. He had a CT in 2023 which showed atrophy and chronic microvascular ischemic disease. He has been on Aricept  since 12/2021 Twana whichs Never Seen Neurologist   He has 2 daughters both are Physicians.   I connected with one of the daughter on phone and she said that they have recently seen worsening in his cognition.  Also patient gets very anxious when they take him out.  He Stephen [name] otherwise staying independent in his ADLs   Past Medical History:  Diagnosis Date   Medical history non-contributory    Melanoma in situ of back (HCC) 2015    Past Surgical History:  Procedure Laterality Date   AXILLARY SENTINEL NODE BIOPSY Right 03/05/2013   Procedure: AXILLARY SENTINEL NODE BIOPSY;  Surgeon: Donnice Bury, MD;  Location: [facility] OR;  Service: General;  Laterality: Right;   basal /sq cell removal  2014   x-4 times removal   EXCISION MELANOMA WITH SENTINEL LYMPH NODE BIOPSY Right 03/05/2013   Procedure: WIDE LOCAL EXCISION RIGHT UPPER BACK  MELANOMA WITH RIGHT AXILLARYSENTINEL NODE BIOPSY;  Surgeon: Donnice Bury, MD;  Location: [facility] OR;  Service: General;  Laterality:  Right;    Allergies[1]  Outpatient Encounter Medications as of 12/02/2024  Medication Sig   acetaminophen  (TYLENOL ) 500 MG tablet Take 500 mg by mouth every 6 (six) hours as needed.   Azelastine  HCl 137 MCG/SPRAY SOLN Place 137 mcg into the nose in the morning and at bedtime.   Calcium Carb-Cholecalciferol (CALCIUM-VITAMIN D) 500-200 MG-UNIT tablet Take 1 tablet by mouth daily.   carboxymethylcellulose (REFRESH PLUS) 0.5 % SOLN Place 1 drop into the left eye as needed.   donepezil   (ARICEPT ) 10 MG tablet Take 1 tablet (10 mg total) by mouth at bedtime.   memantine  (NAMENDA ) 5 MG tablet Take 1 tablet (5 mg total) by mouth daily.   UNABLE TO FIND Take 2,000 mg by mouth daily in the afternoon. Med Name: Om Lion's Mane Mushroom Capsule (Patient not taking: Reported on 12/02/2024)   No facility-administered encounter medications on file as of 12/02/2024.    Review of Systems:  Review of Systems  Constitutional:  Negative for activity change, appetite change and unexpected weight change.  HENT:  Positive for hearing loss.   Respiratory:  Negative for cough and shortness of breath.   Cardiovascular:  Negative for leg swelling.  Gastrointestinal:  Negative for constipation.  Genitourinary:  Negative for frequency.  Musculoskeletal:  Negative for arthralgias, gait problem and myalgias.  Skin: Negative.  Negative for rash.  Neurological:  Negative for dizziness and weakness.  Psychiatric/Behavioral:  Positive for confusion. Negative for sleep disturbance.   All other systems reviewed and are negative.   Health Maintenance  Topic Date Due   Colonoscopy  09/04/2023   COVID-19 Vaccine (7 - 2025-26 season) 07/27/2024   Medicare Annual Wellness (AWV)  04/06/2025   DTaP/Tdap/Td (3 - Td or Tdap) 05/12/2031   Pneumococcal Vaccine: 50+ Years  Completed   Influenza Vaccine  Completed   Zoster Vaccines- Shingrix  Completed   Meningococcal B Vaccine  Aged Out   Hepatitis C Screening  Discontinued    Physical Exam: Vitals:   12/02/24 1326  BP: 138/80  Pulse: (!) 56  Temp: 97.6 F (36.4 C)  SpO2: 98%  Weight: 172 lb (78 kg)  Height: 6' 1 (1.854 m)   Body mass index Stephen [name] 22.69 kg/m. Physical Exam Vitals reviewed.  Constitutional:      Appearance: Normal appearance.  HENT:     Head: Normocephalic.     Ears:     Comments: Wax present in Right Ear    Nose: Nose normal.     Mouth/Throat:     Mouth: Mucous membranes are moist.     Pharynx: Oropharynx Stephen [name] clear.  Eyes:      Pupils: Pupils are equal, round, and reactive to light.  Cardiovascular:     Rate and Rhythm: Normal rate and regular rhythm.     Pulses: Normal pulses.     Heart sounds: No murmur heard. Pulmonary:     Effort: Pulmonary effort Stephen [name] normal. No respiratory distress.     Breath sounds: Normal breath sounds. No rales.  Abdominal:     General: Abdomen Stephen [name] flat. Bowel sounds are normal.     Palpations: Abdomen Stephen [name] soft.  Musculoskeletal:        General: No swelling.     Cervical back: Neck supple.  Skin:    General: Skin Stephen [name] warm.  Neurological:     General: No focal deficit present.     Mental Status: He Stephen [name] alert.     Comments: Failed his Clock   Psychiatric:  Mood and Affect: Mood normal.        Thought Content: Thought content normal.     Labs reviewed: Basic Metabolic Panel: Recent Labs    10/01/24 0810  NA 140  K 4.3  CL 103  CO2 29  GLUCOSE 93  BUN 23  CREATININE 1.16  CALCIUM 9.3  TSH 1.95   Liver Function Tests: Recent Labs    10/01/24 0810  AST 25  ALT 18  BILITOT 0.8  PROT 6.7   No results for input(s): LIPASE, AMYLASE in the last 8760 hours. No results for input(s): AMMONIA in the last 8760 hours. CBC: Recent Labs    10/01/24 0810  WBC 3.3*  NEUTROABS 2,086  HGB 15.5  HCT 46.4  MCV 95.7  PLT 134*   Lipid Panel: Recent Labs    10/01/24 0810  CHOL 179  HDL 66  LDLCALC 99  TRIG 53  CHOLHDL 2.7   No results found for: HGBA1C  Procedures since last visit: No results found.  Assessment/Plan Assessment and Plan     Alzheimer's dementia PTAU 217 Stephen [name] elevated Difficulty with time and calendar management. Increased memory medication to support cognitive function. Already on Aricept  - Increased Namenda  to 5 mg twice daily. - Scheduled follow-up in a couple of months. Working with speech therapist Daughter did not want neurology referral at this time Vitamin B12 deficiency Low vitamin B12 levels, previously discontinued  supplementation. - Restart vitamin B12 supplementation at 1000 mcg daily.         Labs/tests ordered:  * No order type specified * Next appt:  Visit date not found       [1]  Allergies Allergen Reactions   Naproxen Nausea Only    Light headed   "

## 2024-12-02 NOTE — Patient Instructions (Addendum)
 Covid vaccines due.

## 2024-12-03 ENCOUNTER — Telehealth: Payer: Self-pay

## 2024-12-03 NOTE — Telephone Encounter (Signed)
 No He has to take it 5 mg BID. Cannot double the dose in the morning

## 2024-12-03 NOTE — Telephone Encounter (Signed)
 Spoke with Grayce and relayed Charlanne Fredia CROME, MD response

## 2024-12-03 NOTE — Telephone Encounter (Signed)
 Copied from CRM [id number]. Topic: Clinical - Medical Advice >> Dec 03, 2024 12:54 PM [name] ORN wrote: Reason for CRM: [name] Corns called need clarification or advice  on patient memory medication the memantine  (NAMENDA ) 5 MG tablet Understand that the  memantine  (NAMENDA ) 5 MG tablet suppose to be twice a day  Want to know if can double up on the medication in the morning since he take 1 in the morning or do the 2nd dosage of the medication have be taken at  night time with the  donepezil  (ARICEPT ) 10 MG tablet that he is already taking at night

## 2024-12-08 ENCOUNTER — Ambulatory Visit: Payer: Self-pay

## 2024-12-08 NOTE — Telephone Encounter (Signed)
 FYI Only or Action Required?: FYI only for provider: Pt and caregiver will call poison control for further instructions.  Patient was last seen in primary care on 12/02/2024 by Charlanne Fredia CROME, MD.  Called Nurse Triage reporting Medication Problem.  Symptoms began today.  Interventions attempted: Other: nothing.  Symptoms are: No adverse effects at this time.  Triage Disposition: Call [facility] Now  Patient/caregiver understands and will follow disposition?: Yes                     Copied from CRM [id number]. Topic: Clinical - Red Word Triage >> Dec 08, 2024  1:49 PM Miquel SAILOR wrote: Red Word that prompted transfer to Nurse Triage: memantine  (NAMENDA ) 5 MG tablet donepezil  (ARICEPT ) 10 MG tablet Harlene his caregiver PT states  PT has taken all medication 2 memantine  1 donepezil  at once today. Sent message and instructed to send PT to NT Reason for Disposition  [1] DOUBLE DOSE (an extra dose or lesser amount) of prescription drug AND [2] any symptoms (e.g., dizziness, nausea, pain, sleepiness)  Answer Assessment - Initial Assessment Questions 1. NAME of MEDICINE: What medicine(s) are you calling about?     Memantine  and donepezil  2. QUESTION: What is your question? (e.g., double dose of medicine, side effect)     Pt took all 3 tablets this morning  4. SYMPTOMS: Do you have any symptoms? If Yes, ask: What symptoms are you having?  How bad are the symptoms (e.g., mild, moderate, severe)     none  Protocols used: Medication Question Call-A-AH

## 2024-12-08 NOTE — Telephone Encounter (Signed)
 See triage notes from nurse as a FYI

## 2024-12-29 ENCOUNTER — Telehealth: Payer: Self-pay

## 2024-12-29 NOTE — Telephone Encounter (Signed)
 Copied from CRM [id number]. Topic: Clinical - Medical Advice >> Dec 29, 2024  1:53 PM [name] ORN wrote: Reason for CRM: need paperwork from  [name],[name]  to take over patient  financing due to him having dementia  need documentation of his diagnois him having dementia requesting if can get soon as possible ,  can send the document to [name] email:  [email or web address] or reach out to her when the document is ready  Stephen [name] is already poa and need documentation for poa for his fiancial part

## 2024-12-31 NOTE — Telephone Encounter (Signed)
 I am going to give the Letter to [employer] in [facility]. She will give it to the family

## 2025-03-03 ENCOUNTER — Encounter: Admitting: Internal Medicine

## 2025-04-12 ENCOUNTER — Encounter
